# Patient Record
Sex: Male | Born: 1960 | Race: White | Hispanic: No | Smoking: Former smoker
Health system: Southern US, Community
[De-identification: ages and names within clinical notes are randomized; demographics above are authoritative.]

## PROBLEM LIST (undated history)

## (undated) DIAGNOSIS — C4490 Unspecified malignant neoplasm of skin, unspecified: Secondary | ICD-10-CM

## (undated) HISTORY — DX: Unspecified malignant neoplasm of skin, unspecified: C44.90

## (undated) HISTORY — PX: NO PAST SURGERIES: SHX2092

## (undated) HISTORY — PX: APPENDECTOMY: SHX54

---

## 2000-03-19 ENCOUNTER — Encounter: Payer: Self-pay | Admitting: Family Medicine

## 2000-03-19 ENCOUNTER — Encounter: Admission: RE | Admit: 2000-03-19 | Discharge: 2000-03-19 | Payer: Self-pay | Admitting: Family Medicine

## 2008-04-09 ENCOUNTER — Ambulatory Visit: Payer: Self-pay | Admitting: Cardiology

## 2008-04-09 ENCOUNTER — Ambulatory Visit: Payer: Self-pay

## 2008-04-09 ENCOUNTER — Encounter: Payer: Self-pay | Admitting: Cardiology

## 2008-04-09 LAB — CONVERTED CEMR LAB: CK-MB: 2.7 ng/mL (ref 0.3–4.0)

## 2008-05-18 ENCOUNTER — Ambulatory Visit: Payer: Self-pay | Admitting: Cardiology

## 2011-05-05 NOTE — Assessment & Plan Note (Signed)
[facility]                            CARDIOLOGY OFFICE NOTE   KUMAR, FALWELL                         MRN:          [id number]  DATE:04/09/2008                            DOB:          27-Jun-1961    ADDENDUM:  Mr. Schleyer labs returned.  His D-dimer is less than 0.22,  making pulmonary embolus unlikely.  His troponin I is normal at 0.01.  He has essentially ruled out for myocardial infarction, as this was  drawn 14 hours after symptom onset.  I did briefly review his  echocardiogram, and his LV function was normal as well.  The etiology of  his pain is not clear to me.  It may be musculoskeletal or GI.  I have  asked him to take Motrin as needed.  I will schedule him to have an  exercise treadmill for risk stratification.  I will do this in the next  week or two.     Madolyn Frieze Jens Som, MD, [facility]  Electronically Signed    BSC/MedQ  DD: 04/09/2008  DT: 04/09/2008  Job #: [id number]   cc:   [facility]

## 2011-05-05 NOTE — Assessment & Plan Note (Signed)
[facility]                            CARDIOLOGY OFFICE NOTE   Sean Ortiz, Sean Ortiz                         MRN:          [id number]  DATE:04/09/2008                            DOB:          [birth year]    HISTORY:  The patient is a 50 year old male with no prior cardiac  history  whom I am asked to evaluate for chest pain.  Note, he typically  does not have dyspnea on exertion, orthopnea, PND, pedal edema,  palpitations, presyncope, syncope, or exertional chest pain.  He works  third shift.  At approximately 11:00 p.m. last evening, the patient  developed a pressure sensation in his upper chest area.  The pain did  not radiate.  There was no associated nausea, vomiting, shortness of  breath, or diaphoresis.  The pain was not positional, and initially was  not pleuritic.  It was not related to food.  It lasted for 1-2 hours,  and he took 5 aspirin; and it did improve, although never completely  resolving.  He states since that time it has increased with lying flat,  and also with deep inspiration.   He went to [facility] and his  electrocardiogram was felt to be abnormal.  His pain also did not  resolve with a GI cocktail.  We were, therefore, asked to further  evaluate.  Note, at the time of the evaluation his symptoms have been  approximately 14 hours in duration.  Also of note, was that he did  travel to [place] approximately 3 weeks ago which was a 6-hour trip.  He  has not had any leg injury.   He is on no medications routinely.  He takes Advil occasionally as  needed.   He has no known drug allergies.   SOCIAL HISTORY:  He has remote his tobacco use, but has not smoked since  2006.  He consumes between 10-12 beers each weekend by his report.   FAMILY HISTORY:  Negative for coronary disease.   PAST MEDICAL HISTORY:  There is no diabetes mellitus, hypertension or  hyperlipidemia.  He has had no previous  surgeries.  He did state that  years ago he had palpitations; and had a negative Holter monitor, but  there is no other history noted.   REVIEW OF SYSTEMS:  He denies any headaches, fevers, or chills.  There  is no productive cough or hemoptysis.  There is no dysphagia,  odynophagia, melena or hematochezia.  There is no dysuria or hematuria.  There is no seizure activity.  There is no orthopnea, PND, or pedal  edema.  There is no claudication noted.  The remaining systems are  negative.   PHYSICAL EXAM:  Today shows a blood pressure of 122/90 and his pulse was  60.  He weighs 198 pounds.  He is well-developed, well-nourished in no acute stress.  SKIN:  Warm, dry.  He does not appear to be depressed, and there is no peripheral clubbing.  BACK:  Normal.  HEENT:  Normal with normal eyelids.  NECK:  Supple with a normal upstroke  bilaterally.  There are no bruits  noted.  There is no jugular venous distention, and no thyromegaly is  noted.  CHEST:  Clear to auscultation and normal expansion.  CARDIOVASCULAR EXAM:  Reveals a regular rhythm.  Normal S1-S2.  There  are no murmurs, rubs or gallops noted.  Note, he has 2+ radial, femoral,  and dorsalis pedis pulses bilaterally.  They are equal.  ABDOMINAL EXAM:  Nontender.  Positive bowel sounds.  No  hepatosplenomegaly.  No masses appreciated.  There is no abdominal  bruit.  He has no right upper quadrant tenderness.  He has 2+ femoral  pulses bilaterally, and no bruits.  EXTREMITIES:  Show no edema.  I can palpate no cords.  He has 2+  dorsalis pedis pulses bilaterally.  NEUROLOGIC EXAM:  Grossly intact.   I have reviewed the electrocardiogram from [facility] performed on April 09, 2008 at 8:43 a.m..  This showed a  sinus rhythm at a rate of 75.  The axis was normal.  There is no RV  conduction delay, and a prior septal infarct cannot be excluded.  There  are no ST changes noted.  We did repeat his  electrocardiogram at 11:56  here in her office.  This showed a sinus rhythm at a rate of 57.  There  was no RV conduction delay, but there were no ST changes noted.   DIAGNOSIS:  Atypical chest pain.  Sean Ortiz presents with chest pain of  uncertain etiology.  It is now 14 hours in duration.  His  electrocardiogram shows no ST changes.  We will plan to check cardiac  markers now.  If they are negative, then he has essentially ruled out,  as we would expect these to be elevated 14 hours after the onset of  symptoms.  Note, he has also travel to [place] 3 weeks ago.  I think  that the chances of a pulmonary embolus are unlikely, but I will check a  D-dimer to exclude this.  We will also check an echocardiogram as there  is some description that may be consistent with pericarditis as he  states that it gets worse when he takes a deep breath, and also when he  lies flat.  If the above is negative, then we may give him a short  course of nonsteroidals.  I do not find him to be tender on examination,  and I doubt that this is musculoskeletal.  It also does not sound GI in  etiology.  We will make further recommendations once we have the above  information.     Madolyn Frieze Jens Som, MD, [facility]  Electronically Signed    BSC/MedQ  DD: 04/09/2008  DT: 04/09/2008  Job #: [id number]

## 2011-05-05 NOTE — Procedures (Signed)
 HEALTHCARE                              EXERCISE Zada Girt   QUENTEN, NAWAZ                         MRN:          [id number]  DATE:05/18/2008                            DOB:          01-20-1961    The patient is a 50 year old male, that I recently saw in the office on  April 09, 2008, secondary to atypical chest pain.  This study is  performed for risk stratification.   The patient exercised for a duration of 9 minutes and 52 seconds on the  Bruce protocol, which is equivalent of 11.7 METS.  His heart rate  increased from resting of 69 to a maximum of 162 which was 92% of his  predicted maximum.  His blood pressure increased from 117/83 to 171/77.  There was no chest pain during the study.  There were no ST changes.  This study was terminated secondary to leg pain.   FINAL INTERPRETATION:  Negative adequate exercise tolerance test.     Madolyn Frieze. Jens Som, MD, [facility]  Electronically Signed    BSC/MedQ  DD: 05/18/2008  DT: 05/18/2008  Job #: [id number]

## 2014-10-15 ENCOUNTER — Encounter (INDEPENDENT_AMBULATORY_CARE_PROVIDER_SITE_OTHER): Payer: Self-pay

## 2014-10-15 ENCOUNTER — Ambulatory Visit (INDEPENDENT_AMBULATORY_CARE_PROVIDER_SITE_OTHER): Payer: 59 | Admitting: Family Medicine

## 2014-10-15 ENCOUNTER — Encounter: Payer: Self-pay | Admitting: Family Medicine

## 2014-10-15 ENCOUNTER — Telehealth: Payer: Self-pay | Admitting: Family Medicine

## 2014-10-15 VITALS — BP 131/83 | HR 72 | Temp 99.0°F | Wt 224.5 lb

## 2014-10-15 DIAGNOSIS — N508 Other specified disorders of male genital organs: Secondary | ICD-10-CM

## 2014-10-15 DIAGNOSIS — N50819 Testicular pain, unspecified: Secondary | ICD-10-CM

## 2014-10-15 MED ORDER — CIPROFLOXACIN HCL 500 MG PO TABS: 500.0000 mg | ORAL_TABLET | Freq: Two times a day (BID) | ORAL | Status: DC

## 2014-10-15 NOTE — Telephone Encounter (Signed)
APPT MADE FOR TODAY

## 2014-10-15 NOTE — Progress Notes (Signed)
   Subjective:    Patient ID: Sean Ortiz, male    DOB: 17-Dec-1961, 53 y.o.   MRN: [id number]  HPI Patient is here with c/o right testicular discomfort.  He states he was told he had left hernia and he has been keeping an eye on it.  He states he has left testicular swelling and he states the real problem is that he has developed right testicular pain.  Review of Systems  Constitutional: Negative for fever.  HENT: Negative for ear pain.   Eyes: Negative for discharge.  Respiratory: Negative for cough.   Cardiovascular: Negative for chest pain.  Gastrointestinal: Negative for abdominal distention.  Endocrine: Negative for polyuria.  Genitourinary: Negative for difficulty urinating.  Musculoskeletal: Negative for gait problem and neck pain.  Skin: Negative for color change and rash.  Neurological: Negative for speech difficulty and headaches.  Psychiatric/Behavioral: Negative for agitation.       Objective:    BP 131/83  Pulse 72  Temp(Src) 99 F (37.2 C) (Oral)  Wt 224 lb 8 oz (101.833 kg) Physical Exam  Constitutional: He is oriented to person, place, and time. He appears well-developed and well-nourished.  HENT:  Head: Normocephalic and atraumatic.  Mouth/Throat: Oropharynx is clear and moist.  Eyes: Pupils are equal, round, and reactive to light.  Neck: Normal range of motion. Neck supple.  Cardiovascular: Normal rate and regular rhythm.   No murmur heard. Pulmonary/Chest: Effort normal and breath sounds normal.  Abdominal: Soft. Bowel sounds are normal. There is no tenderness.  Genitourinary:  Left testicle with swelling and with soft wormy sensation in the tissue above the epididymis.  The right teste is soft but epididymis is tender.  No inguinal hernia noted.  Circumcised with normal male phalus.  Neurological: He is alert and oriented to person, place, and time.  Skin: Skin is warm and dry.  Psychiatric: He has a normal mood and affect.          Assessment  & Plan:     ICD-9-CM ICD-10-CM   1. Testicular pain 608.9 N50.8 ciprofloxacin (CIPRO) 500 MG tablet     US Scrotum     No Follow-up on file.  Lysbeth Penner FNP

## 2014-10-16 ENCOUNTER — Other Ambulatory Visit: Payer: Self-pay | Admitting: Family Medicine

## 2014-10-16 DIAGNOSIS — N50819 Testicular pain, unspecified: Secondary | ICD-10-CM

## 2014-10-17 ENCOUNTER — Telehealth: Payer: Self-pay | Admitting: Family Medicine

## 2014-10-17 ENCOUNTER — Ambulatory Visit (HOSPITAL_COMMUNITY)
Admission: RE | Admit: 2014-10-17 | Discharge: 2014-10-17 | Disposition: A | Discharge status: Home / Self Care | Payer: 59 | Source: Ambulatory Visit | Attending: Family Medicine | Admitting: Family Medicine

## 2014-10-17 DIAGNOSIS — N508 Other specified disorders of male genital organs: Secondary | ICD-10-CM | POA: Insufficient documentation

## 2014-10-17 DIAGNOSIS — N50819 Testicular pain, unspecified: Secondary | ICD-10-CM

## 2014-10-17 NOTE — Telephone Encounter (Signed)
Pt notified of [place] results Instructed pt to finish antibiotic and to call back if no improvement Verbalizes understanding

## 2014-10-17 NOTE — Telephone Encounter (Signed)
He really wants to talk to Sean Ortiz about his Exam and the treatment and the scans he had.groin  He had right abdomen pain and groin pain

## 2014-10-18 NOTE — Telephone Encounter (Signed)
Pt notified of results

## 2014-10-18 NOTE — Telephone Encounter (Signed)
Message copied by Marin Olp on Thu Oct 18, 2014  9:52 AM ------      Message from: Lysbeth Penner      Created: Wed Oct 17, 2014  6:22 PM       Left hydrocele - air filled sac, benign and left varicocele, varicose veins in left scrotum, both findings benign. ------

## 2015-01-03 ENCOUNTER — Encounter: Payer: Self-pay | Admitting: Family Medicine

## 2015-01-03 ENCOUNTER — Ambulatory Visit (INDEPENDENT_AMBULATORY_CARE_PROVIDER_SITE_OTHER): Payer: 59 | Admitting: Family Medicine

## 2015-01-03 VITALS — BP 135/88 | HR 74 | Temp 97.1°F | Ht 71.5 in | Wt 222.0 lb

## 2015-01-03 DIAGNOSIS — Z23 Encounter for immunization: Secondary | ICD-10-CM

## 2015-01-03 DIAGNOSIS — Z0000 Encounter for general adult medical examination without abnormal findings: Secondary | ICD-10-CM

## 2015-01-03 DIAGNOSIS — Z1211 Encounter for screening for malignant neoplasm of colon: Secondary | ICD-10-CM

## 2015-01-03 NOTE — Progress Notes (Signed)
   Subjective:    Patient ID: Sean Ortiz, male    DOB: 08-Oct-1961, 54 y.o.   MRN: [id number]  HPI Patient comes in today for his annual physical. He would like a flu shot as well.  There are no active problems to display for this patient.  Outpatient Encounter Prescriptions as of 01/03/2015  Medication Sig  . glucosamine-chondroitin 500-400 MG tablet Take 1 tablet by mouth daily.  . [DISCONTINUED] ciprofloxacin (CIPRO) 500 MG tablet Take 1 tablet (500 mg total) by mouth 2 (two) times daily.      Review of Systems     Objective:   Physical Exam  BP 135/88 mmHg  Pulse 74  Temp(Src) 97.1 F (36.2 C) (Oral)  Ht 5' 11.5" (1.816 m)  Wt 222 lb (100.699 kg)  BMI 30.53 kg/m2       Assessment & Plan:

## 2015-01-03 NOTE — Progress Notes (Signed)
   Subjective:    Patient ID: Sean Ortiz, male    DOB: 24-Aug-1961, 54 y.o.   MRN: [id number]  HPI 54 year old gentleman here for an annual physical. Generally feels well. There is no family history to suggest he is at risk for disease. He does admit to being overweight and as a New Year's resolution is trying to work on that. He had some chest pain several years ago and was evaluated but there was no finding of coronary disease.  We discussed screening exams at age 76 and he is leaning toward colonoscopy but we also had discussion of prostate exams and he declines that at present    Review of Systems  Constitutional: Negative.   HENT: Negative.   Eyes: Negative.   Respiratory: Negative.  Negative for shortness of breath.   Cardiovascular: Negative.  Negative for chest pain and leg swelling.  Gastrointestinal: Negative.   Genitourinary: Negative.   Musculoskeletal: Negative.   Skin: Negative.   Neurological: Negative.   Psychiatric/Behavioral: Negative.   All other systems reviewed and are negative.      Objective:   Physical Exam  Constitutional: He is oriented to person, place, and time. He appears well-developed and well-nourished.  HENT:  Head: Normocephalic.  Right Ear: External ear normal.  Left Ear: External ear normal.  Nose: Nose normal.  Mouth/Throat: Oropharynx is clear and moist.  Eyes: Conjunctivae and EOM are normal. Pupils are equal, round, and reactive to light.  Neck: Normal range of motion. Neck supple.  Cardiovascular: Normal rate, regular rhythm, normal heart sounds and intact distal pulses.   Pulmonary/Chest: Effort normal and breath sounds normal.  Abdominal: Soft. Bowel sounds are normal.  Musculoskeletal: Normal range of motion.  Neurological: He is alert and oriented to person, place, and time.  Skin: Skin is warm and dry.  Psychiatric: He has a normal mood and affect. His behavior is normal. Judgment and thought content normal.            Assessment & Plan:  1. Encounter for screening colonoscopy  - Ambulatory referral to Gastroenterology  2. Visit for annual health examination  - CMP14+EGFR - Lipid panel - PSA, total and free  3. Encounter for immunization   Wardell Honour MD

## 2015-01-04 ENCOUNTER — Encounter: Payer: Self-pay | Admitting: Internal Medicine

## 2015-01-04 LAB — CMP14+EGFR
ALT: 35 IU/L (ref 0–44)
AST: 29 IU/L (ref 0–40)
Albumin/Globulin Ratio: 1.7 (ref 1.1–2.5)
Albumin: 4.7 g/dL (ref 3.5–5.5)
Alkaline Phosphatase: 46 IU/L (ref 39–117)
BILIRUBIN TOTAL: 0.6 mg/dL (ref 0.0–1.2)
BUN/Creatinine Ratio: 22 — ABNORMAL HIGH (ref 9–20)
BUN: 19 mg/dL (ref 6–24)
CALCIUM: 9.1 mg/dL (ref 8.7–10.2)
CHLORIDE: 99 mmol/L (ref 97–108)
CO2: 26 mmol/L (ref 18–29)
Creatinine, Ser: 0.87 mg/dL (ref 0.76–1.27)
GFR, EST AFRICAN AMERICAN: 115 mL/min/{1.73_m2} (ref >59)
GFR, EST NON AFRICAN AMERICAN: 99 mL/min/{1.73_m2} (ref >59)
GLOBULIN, TOTAL: 2.7 g/dL (ref 1.5–4.5)
GLUCOSE: 95 mg/dL (ref 65–99)
Potassium: 4.6 mmol/L (ref 3.5–5.2)
Sodium: 140 mmol/L (ref 134–144)
Total Protein: 7.4 g/dL (ref 6.0–8.5)

## 2015-01-04 LAB — LIPID PANEL
CHOL/HDL RATIO: 5 ratio (ref 0.0–5.0)
CHOLESTEROL TOTAL: 205 mg/dL — AB (ref 100–199)
HDL: 41 mg/dL (ref >39)
LDL CALC: 151 mg/dL — AB (ref 0–99)
Triglycerides: 64 mg/dL (ref 0–149)
VLDL Cholesterol Cal: 13 mg/dL (ref 5–40)

## 2015-01-04 LAB — PSA, TOTAL AND FREE
PSA FREE PCT: 46.7 %
PSA, Free: 0.14 ng/mL
PSA: 0.3 ng/mL (ref 0.0–4.0)

## 2015-02-08 ENCOUNTER — Ambulatory Visit (AMBULATORY_SURGERY_CENTER): Payer: Self-pay | Admitting: *Deleted

## 2015-02-08 VITALS — Ht 72.0 in | Wt 228.0 lb

## 2015-02-08 DIAGNOSIS — Z1211 Encounter for screening for malignant neoplasm of colon: Secondary | ICD-10-CM

## 2015-02-08 MED ORDER — MOVIPREP 100 G PO SOLR: 1.0000 | Freq: Once | ORAL | Status: DC

## 2015-02-08 NOTE — Progress Notes (Signed)
No egg or soy allergy No diet pills No home 02 use Never been sedated in the past  Pt declined emmi video Had to RS from 3-4 to 3-9 due to work travel.

## 2015-02-22 ENCOUNTER — Encounter: Payer: Self-pay | Admitting: Internal Medicine

## 2015-02-27 ENCOUNTER — Encounter: Payer: Self-pay | Admitting: Internal Medicine

## 2015-02-27 ENCOUNTER — Ambulatory Visit (AMBULATORY_SURGERY_CENTER): Payer: 59 | Admitting: Internal Medicine

## 2015-02-27 VITALS — BP 101/64 | HR 52 | Temp 97.1°F | Resp 15 | Ht 71.0 in | Wt 222.0 lb

## 2015-02-27 DIAGNOSIS — Z1211 Encounter for screening for malignant neoplasm of colon: Secondary | ICD-10-CM

## 2015-02-27 MED ORDER — SODIUM CHLORIDE 0.9 % IV SOLN: 500.0000 mL | INTRAVENOUS | Status: DC

## 2015-02-27 NOTE — Progress Notes (Signed)
A/ox3, pleased with MAC, report to RN 

## 2015-02-27 NOTE — Op Note (Signed)
[place]  [address] [place], [id number]   COLONOSCOPY PROCEDURE REPORT  PATIENT: Sean Ortiz, Sean Ortiz  MR#: [id number] BIRTHDATE: 12/02/1961 , 53  yrs. old GENDER: male ENDOSCOPIST: Lafayette Dragon, MD REFERRED JD:BZMCEYE Sabra Heck, M.D. PROCEDURE DATE:  02/27/2015 PROCEDURE:   Colonoscopy, screening First Screening Colonoscopy - Avg.  risk and is 50 yrs.  old or older Yes.  Prior Negative Screening - Now for repeat screening. N/A  History of Adenoma - Now for follow-up colonoscopy & has been > or = to 3 yrs.  N/A  Polyps Removed Today? No. ASA CLASS:   Class I INDICATIONS:average risk patient for colon cancer. MEDICATIONS: Monitored anesthesia care and Propofol 250 mg IV  DESCRIPTION OF PROCEDURE:   After the risks benefits and alternatives of the procedure were thoroughly explained, informed consent was obtained.  The digital rectal exam revealed no abnormalities of the rectum.   The LB [id number]  endoscope was introduced through the anus and advanced to the cecum, which was identified by both the appendix and ileocecal valve. No adverse events experienced.   The quality of the prep was good.  (MoviPrep was used)  The instrument was then slowly withdrawn as the colon was fully examined.      COLON FINDINGS: A normal appearing cecum, ileocecal valve, and appendiceal orifice were identified.  The ascending, transverse, descending, sigmoid colon, and rectum appeared unremarkable. Retroflexed views revealed no abnormalities and Retroflexed views revealed internal Grade I hemorrhoids. The time to cecum = 9.01 Withdrawal time = 6.14   The scope was withdrawn and the procedure completed. COMPLICATIONS: There were no immediate complications.  ENDOSCOPIC IMPRESSION: Normal colonoscopy  RECOMMENDATIONS: High fiber diet Recall colonoscopy in 10 years  eSigned:  Lafayette Dragon, MD 02/27/2015 8:03 AM   cc: Alain Honey, MD

## 2015-02-27 NOTE — Patient Instructions (Signed)
YOU HAD AN ENDOSCOPIC PROCEDURE TODAY AT [facility]:   Refer to the procedure report that was given to you for any specific questions about what was found during the examination.  If the procedure report does not answer your questions, please call your gastroenterologist to clarify.  If you requested that your care partner not be given the details of your procedure findings, then the procedure report has been included in a sealed envelope for you to review at your convenience later.  YOU SHOULD EXPECT: Some feelings of bloating in the abdomen. Passage of more gas than usual.  Walking can help get rid of the air that was put into your GI tract during the procedure and reduce the bloating. If you had a lower endoscopy (such as a colonoscopy or flexible sigmoidoscopy) you may notice spotting of blood in your stool or on the toilet paper. If you underwent a bowel prep for your procedure, you may not have a normal bowel movement for a few days.  Please Note:  You might notice some irritation and congestion in your nose or some drainage.  This is from the oxygen used during your procedure.  There is no need for concern and it should clear up in a day or so.  SYMPTOMS TO REPORT IMMEDIATELY:   Following lower endoscopy (colonoscopy or flexible sigmoidoscopy):  Excessive amounts of blood in the stool  Significant tenderness or worsening of abdominal pains  Swelling of the abdomen that is new, acute  Fever of 100F or higher   For urgent or emergent issues, a gastroenterologist can be reached at any hour by calling [phone].   DIET: Your first meal following the procedure should be a small meal and then it is ok to progress to your normal diet. Heavy or fried foods are harder to digest and may make you feel nauseous or bloated.  Likewise, meals heavy in dairy and vegetables can increase bloating.  Drink plenty of fluids but you should avoid alcoholic beverages for 24  hours.  ACTIVITY:  You should plan to take it easy for the rest of today and you should NOT DRIVE or use heavy machinery until tomorrow (because of the sedation medicines used during the test).    FOLLOW UP: Our staff will call the number listed on your records the next business day following your procedure to check on you and address any questions or concerns that you may have regarding the information given to you following your procedure. If we do not reach you, we will leave a message.  However, if you are feeling well and you are not experiencing any problems, there is no need to return our call.  We will assume that you have returned to your regular daily activities without incident.  If any biopsies were taken you will be contacted by phone or by letter within the next 1-3 weeks.  Please call us at [phone] if you have not heard about the biopsies in 3 weeks.    SIGNATURES/CONFIDENTIALITY: You and/or your care partner have signed paperwork which will be entered into your electronic medical record.  These signatures attest to the fact that that the information above on your After Visit Summary has been reviewed and is understood.  Full responsibility of the confidentiality of this discharge information lies with you and/or your care-partner.  High fiber diet-handout given  Repeat colonoscopy in 10 years 2026.

## 2015-02-28 ENCOUNTER — Telehealth: Payer: Self-pay | Admitting: *Deleted

## 2015-02-28 NOTE — Telephone Encounter (Signed)
  Follow up Call-  Call back number 02/27/2015  Post procedure Call Back phone  # [phone]  Permission to leave phone message Yes     Patient questions:  Do you have a fever, pain , or abdominal swelling? No. Pain Score  0 *  Have you tolerated food without any problems? Yes.    Have you been able to return to your normal activities? Yes.    Do you have any questions about your discharge instructions: Diet   No. Medications  No. Follow up visit  No.  Do you have questions or concerns about your Care? No.  Actions: * If pain score is 4 or above: No action needed, pain <4.

## 2015-04-28 ENCOUNTER — Encounter: Payer: Self-pay | Admitting: *Deleted

## 2017-01-01 ENCOUNTER — Ambulatory Visit (INDEPENDENT_AMBULATORY_CARE_PROVIDER_SITE_OTHER): Payer: 59 | Admitting: Family Medicine

## 2017-01-01 ENCOUNTER — Encounter: Payer: Self-pay | Admitting: Family Medicine

## 2017-01-01 VITALS — BP 131/89 | HR 72 | Temp 97.9°F | Ht 71.0 in | Wt 240.6 lb

## 2017-01-01 DIAGNOSIS — L219 Seborrheic dermatitis, unspecified: Secondary | ICD-10-CM | POA: Diagnosis not present

## 2017-01-01 DIAGNOSIS — L210 Seborrhea capitis: Secondary | ICD-10-CM

## 2017-01-01 DIAGNOSIS — B350 Tinea barbae and tinea capitis: Secondary | ICD-10-CM

## 2017-01-01 MED ORDER — TERBINAFINE HCL 250 MG PO TABS: 250.0000 mg | ORAL_TABLET | Freq: Every day | ORAL | 0 refills | Status: DC

## 2017-01-01 NOTE — Patient Instructions (Signed)
Great to see you!  I treating you for what I believe is a fungal scalp infection, and its no serious form it's called a Kerion. In your case I am treateing it as Tinea Capitis.   We will call with labs within 1 week.   Lets plan to follow up for a physical exam in 6 weeks to see how it went.

## 2017-01-01 NOTE — Progress Notes (Signed)
   HPI  Patient presents today here with a scalp rash.  Patient explains this is been present since a trip to [place] in February 2017. He states it's very itchy with red bumps. He's tried several treatments including minocycline, metronidazole, clindamycin, ketoconazole shampoo, flecainide. Patient has seen 2 different dermatologists.  He had a facial rash, most likely seborrhea, which has been treated very well.  He comes in for another opinion.  PMH: Smoking status noted ROS: Per HPI  Objective: BP 131/89   Pulse 72   Temp 97.9 F (36.6 C) (Oral)   Ht 5\' 11"  (1.803 m)   Wt 240 lb 9.6 oz (109.1 kg)   BMI 33.56 kg/m  Gen: NAD, alert, cooperative with exam HEENT: NCAT CV: RRR, good S1/S2, no murmur Resp: CTABL, no wheezes, non-labored Ext: No edema, warm Neuro: Alert and oriented, No gross deficits  Skin:  5-10 erythematous subcutaneous nodules on the scalp, some with heme crusting and excoriation, no lymph nodes palpable at the occipital base  Assessment and plan:  # Tinea capitis I believe this is the most likely etiology, patient did not have any clear improvement with ketoconazole shampoo No clear evidence of kerion Treat with terbinafine once daily for 6 weeks, checking LFTs today. Follow-up in 6 weeks for annual physical exam, plan referral to academic center if symptoms are not improved at that time.   Meds ordered this encounter  Medications  . fluocinonide (LIDEX) 0.05 % external solution  . ketoconazole (NIZORAL) 2 % shampoo  . terbinafine (LAMISIL) 250 MG tablet    Sig: Take 1 tablet (250 mg total) by mouth daily.    Dispense:  42 tablet    Refill:  0    Laroy Apple, MD [facility] 01/01/2017, 3:48 PM

## 2017-01-02 LAB — CMP14+EGFR
ALT: 34 IU/L (ref 0–44)
AST: 26 IU/L (ref 0–40)
Albumin/Globulin Ratio: 1.5 (ref 1.2–2.2)
Albumin: 4.5 g/dL (ref 3.5–5.5)
Alkaline Phosphatase: 66 IU/L (ref 39–117)
BILIRUBIN TOTAL: 0.3 mg/dL (ref 0.0–1.2)
BUN/Creatinine Ratio: 23 — ABNORMAL HIGH (ref 9–20)
BUN: 19 mg/dL (ref 6–24)
CO2: 25 mmol/L (ref 18–29)
Calcium: 9.2 mg/dL (ref 8.7–10.2)
Chloride: 97 mmol/L (ref 96–106)
Creatinine, Ser: 0.84 mg/dL (ref 0.76–1.27)
GFR calc non Af Amer: 99 mL/min/{1.73_m2} (ref >59)
GFR, EST AFRICAN AMERICAN: 115 mL/min/{1.73_m2} (ref >59)
Globulin, Total: 3 g/dL (ref 1.5–4.5)
Glucose: 93 mg/dL (ref 65–99)
Potassium: 4.2 mmol/L (ref 3.5–5.2)
Sodium: 138 mmol/L (ref 134–144)
TOTAL PROTEIN: 7.5 g/dL (ref 6.0–8.5)

## 2017-02-02 ENCOUNTER — Other Ambulatory Visit: Payer: Self-pay | Admitting: Family Medicine

## 2017-03-10 DIAGNOSIS — L814 Other melanin hyperpigmentation: Secondary | ICD-10-CM | POA: Diagnosis not present

## 2017-03-10 DIAGNOSIS — L821 Other seborrheic keratosis: Secondary | ICD-10-CM | POA: Diagnosis not present

## 2017-03-10 DIAGNOSIS — D1801 Hemangioma of skin and subcutaneous tissue: Secondary | ICD-10-CM | POA: Diagnosis not present

## 2017-03-10 DIAGNOSIS — L570 Actinic keratosis: Secondary | ICD-10-CM | POA: Diagnosis not present

## 2017-12-29 DIAGNOSIS — L239 Allergic contact dermatitis, unspecified cause: Secondary | ICD-10-CM | POA: Diagnosis not present

## 2018-01-05 DIAGNOSIS — Z23 Encounter for immunization: Secondary | ICD-10-CM | POA: Diagnosis not present

## 2018-01-05 DIAGNOSIS — Z0000 Encounter for general adult medical examination without abnormal findings: Secondary | ICD-10-CM | POA: Diagnosis not present

## 2018-01-10 DIAGNOSIS — D729 Disorder of white blood cells, unspecified: Secondary | ICD-10-CM | POA: Diagnosis not present

## 2018-01-12 ENCOUNTER — Encounter: Payer: Self-pay | Admitting: Hematology

## 2018-01-12 ENCOUNTER — Telehealth: Payer: Self-pay | Admitting: Hematology

## 2018-01-12 NOTE — Telephone Encounter (Signed)
Pt has been scheduled to see Dr. Burr Medico on 1/29 at 830am. Pt agreed to the appt date and time. Address verified. Letter mailed. Referring office notified.

## 2018-01-17 NOTE — Progress Notes (Signed)
[place]  Telephone:(336) [phone] Fax:(336) [place] consult Note   Patient Care Team: Wardell Honour, MD as PCP - General (Family Medicine) Ernst Spell, MD as Referring Physician (Urology) 01/18/2018  REFERRAL PHYSICIAN: Aura Dials, MD   CHIEF COMPLAINTS/PURPOSE OF CONSULTATION:  Leukopenia   HISTORY OF PRESENTING ILLNESS:  Sean Ortiz 57 y.o. male is a here because of leukopenia. The patient was referred by his PCP Dr. Sheryn Bison at [place] at [facility]. The patient presents to the clinic today accompanied by his girl friend.   This was discovered by his routine physical exam and lab.  He recently changed his primary care physician, and was first seen by Dr. Sheryn Bison on January 05, 2018.  His CBC showed WBC 2.5, with absolute neutrophil 1.3, absolute lymphocytes 0.90, rest of CBC was normal.  His CMP was also normal.  He was asymptomatic during that visit, denies any fever, chills, cough, or other new symptoms.  He had a repeated CBC on January 10, 2018, which showed WBC 2.7, with absolute neutrophil 1.3, and absolute lymphocytes 1.2, the rest of CBC was normal.  Patient does not recall he had abnormal CBC in the past.  No significant past medical history except basal cell skin cancer on the chest, which was removed, and recurrent skin rash from his scalp to upper chest since about 2 years ago, after he traveled back from [place].  He describes his rash is very itching, he was previously treated with topical and oral antifungal medication, which did not help.  He has seen 2 different dermatologist, and was started on topical steroids recently, which did improve, but he still has recurrent rash.  He denies any significant arthritis, no history of autoimmune disease.  His mother had vasculitis in the past.  Review of system otherwise negative.  He has good appetite and energy level, denies fatigue, weight loss, night sweats, fever or recent episodes of  infection in the past few years.  MEDICAL HISTORY:  Past Medical History:  Diagnosis Date  . Skin cancer     SURGICAL HISTORY: Past Surgical History:  Procedure Laterality Date  . NO PAST SURGERIES      SOCIAL HISTORY: Social History   Socioeconomic History  . Marital status: Divorced    Spouse name: Not on file  . Number of children: Not on file  . Years of education: Not on file  . Highest education level: Not on file  Social Needs  . Financial resource strain: Not on file  . Food insecurity - worry: Not on file  . Food insecurity - inability: Not on file  . Transportation needs - medical: Not on file  . Transportation needs - non-medical: Not on file  Occupational History  . Not on file  Tobacco Use  . Smoking status: Former Smoker    Packs/day: 1.00    Years: 30.00    Pack years: 30.00    Last attempt to quit: 05/21/2006    Years since quitting: 11.6  . Smokeless tobacco: Never Used  Substance and Sexual Activity  . Alcohol use: Yes    Alcohol/week: 1.2 - 1.8 oz    Types: 2 - 3 Cans of beer per week    Comment: occ  . Drug use: No  . Sexual activity: Not on file  Other Topics Concern  . Not on file  Social History Narrative  . Not on file    FAMILY HISTORY: Family History  Problem Relation  Age of Onset  . Vasculitis Mother   . Colon cancer Neg Hx   . Rectal cancer Neg Hx   . Stomach cancer Neg Hx   . Esophageal cancer Neg Hx     ALLERGIES:  has No Known Allergies.  MEDICATIONS:  Current Outpatient Medications  Medication Sig Dispense Refill  . fluocinonide (LIDEX) 0.05 % external solution     . ketoconazole (NIZORAL) 2 % shampoo     . terbinafine (LAMISIL) 250 MG tablet TAKE 1 TABLET BY MOUTH EVERY DAY 42 tablet 0   No current facility-administered medications for this visit.     REVIEW OF SYSTEMS:   Constitutional: Denies fevers, chills or abnormal night sweats Eyes: Denies blurriness of vision, double vision or watery eyes Ears, nose,  mouth, throat, and face: Denies mucositis or sore throat Respiratory: Denies cough, dyspnea or wheezes Cardiovascular: Denies palpitation, chest discomfort or lower extremity swelling Gastrointestinal:  Denies nausea, heartburn or change in bowel habits Skin: (+) skin rash  Lymphatics: Denies new lymphadenopathy or easy bruising Neurological:Denies numbness, tingling or new weaknesses Behavioral/Psych: Mood is stable, no new changes  All other systems were reviewed with the patient and are negative.  PHYSICAL EXAMINATION: ECOG PERFORMANCE STATUS: 0 - Asymptomatic  Vitals:   01/18/18 0837  BP: (!) 141/93  Pulse: 69  Resp: 18  Temp: 98.5 F (36.9 C)  SpO2: 100%   Filed Weights   01/18/18 0837  Weight: 236 lb 12.8 oz (107.4 kg)    GENERAL:alert, no distress and comfortable SKIN: skin color, texture, turgor are normal, (+) diffuse papular rash on his scalp face, neck and upper chest EYES: normal, conjunctiva are pink and non-injected, sclera clear OROPHARYNX:no exudate, no erythema and lips, buccal mucosa, and tongue normal  NECK: supple, thyroid normal size, non-tender, without nodularity LYMPH:  no palpable lymphadenopathy in the cervical, axillary or inguinal LUNGS: clear to auscultation and percussion with normal breathing effort HEART: regular rate & rhythm and no murmurs and no lower extremity edema ABDOMEN:abdomen soft, non-tender and normal bowel sounds Musculoskeletal:no cyanosis of digits and no clubbing  PSYCH: alert & oriented x 3 with fluent speech NEURO: no focal motor/sensory deficits  LABORATORY DATA:  I have reviewed the data as listed CBC    Component Value Date/Time   WBC 2.4 (L) 01/18/2018 0943   RBC 4.53 01/18/2018 0943   RBC 4.53 01/18/2018 0943   HCT 41.8 01/18/2018 0943   PLT 175 01/18/2018 0943   MCV 92.3 01/18/2018 0943   MCH 29.6 01/18/2018 0943   MCHC 32.1 01/18/2018 0943   RDW 13.7 01/18/2018 0943   LYMPHSABS 0.8 (L) 01/18/2018 0943    MONOABS 0.2 01/18/2018 0943   EOSABS 0.0 01/18/2018 0943   BASOSABS 0.0 01/18/2018 0943     CMP Latest Ref Rng & Units 01/01/2017 01/03/2015  Glucose 65 - 99 mg/dL 93 95  BUN 6 - 24 mg/dL 19 19  Creatinine 0.76 - 1.27 mg/dL 0.84 0.87  Sodium 134 - 144 mmol/L 138 140  Potassium 3.5 - 5.2 mmol/L 4.2 4.6  Chloride 96 - 106 mmol/L 97 99  CO2 18 - 29 mmol/L 25 26  Calcium 8.7 - 10.2 mg/dL 9.2 9.1  Total Protein 6.0 - 8.5 g/dL 7.5 7.4  Total Bilirubin 0.0 - 1.2 mg/dL 0.3 0.6  Alkaline Phos 39 - 117 IU/L 66 46  AST 0 - 40 IU/L 26 29  ALT 0 - 44 IU/L 34 35     RADIOGRAPHIC STUDIES: I have personally  reviewed the radiological images as listed and agreed with the findings in the report. No results found.  ASSESSMENT & PLAN:   Sean Ortiz 57 y.o. Caucasian male without significant past medical history, except recurrent skin rash from scalp to upper chest in the past 2 years, is here because of mild leukopenia which was found on his routine lab work.   1. Leukopenia, mild neutropenia and leukopenia -He was found to have a mild leukopenia on his routine wellness exam, with predominant mild neutropenia and mild neutropenia.  Unfortunately I do not have his old CBC to compare, per patient, he had no history of leukopenia in the past.  He has not had recurrent infection lately.  He is asymptomatic. -We discussed the possible etiology of leukopenia, including autoimmune related, folate or B12 deficiency, alcohol or medication induced, virus induced (including acute virus infection, or chronic hepatitis B, hepatitis C or HIV infection), or primary pulmonary disease, such as MDS.  Given his asymptomatic condition, no peripheral adenopathy on exam, no spleen megaly, I think leukemia or lymphoma much less likely. -We also discussed the possibility of normal variant, which we see more in African Americans. -He does have recurrent skin rash, which responded well to topical steroids.  I am wondering if he  has mild underlying autoimmune disease which causes his skin rash and mild leukopenia.  His skin rash has been recurring, bothersome, I discussed with patient if he will get in the oral treatment for his skin rash, such as a short course of prednisone, then we should monitor his CBC when he is on treatment and after treatment, to see if he responds to immunosuppressant.  If he does, he will support autoimmune related neutropenia. -We discussed the risk of infection from his leukopenia.  Given his mild leukopenia, he is not at significant increased risk of infection.  We did discuss infection precautions.  I did not think he warrants any treatment at this point given the mild leukopenia.  -I suggest him to monitor his CBC, if his room became pia gets worse, or he develops other cytopenias, we will definitely get a bone marrow biopsy.    PLAN:  -will repeat CBC with differential, check reticulocyte count, folate, B12, hepatitis B and C, LDH, methylmalonic acid level, SER, ANA, rheumatoid arthritis titer, and I will review his peripheral blood smear. I will call him in a few weeks to discuss the lab results.  -As above lab results are negative, will repeat a CBC in 6 weeks at his primary care physician's office, and I will see him back in 3 months with lab CBC with diff  -pt will call me if he will be treated with oral medication for his skin rash, then we will f/u his CBC every 2-3 weeks when he stars treatment     All questions were answered. The patient knows to call the clinic with any problems, questions or concerns. I spent 30 minutes counseling the patient face to face. The total time spent in the appointment was 40  minutes and more than 50% was on counseling.     Truitt Merle, MD 01/18/2018

## 2018-01-18 ENCOUNTER — Encounter: Payer: Self-pay | Admitting: Hematology

## 2018-01-18 ENCOUNTER — Telehealth: Payer: Self-pay | Admitting: Hematology

## 2018-01-18 ENCOUNTER — Inpatient Hospital Stay: Payer: 59

## 2018-01-18 ENCOUNTER — Inpatient Hospital Stay: Payer: 59 | Attending: Hematology | Admitting: Hematology

## 2018-01-18 DIAGNOSIS — D709 Neutropenia, unspecified: Secondary | ICD-10-CM | POA: Diagnosis not present

## 2018-01-18 DIAGNOSIS — Z87891 Personal history of nicotine dependence: Secondary | ICD-10-CM

## 2018-01-18 DIAGNOSIS — Z85828 Personal history of other malignant neoplasm of skin: Secondary | ICD-10-CM

## 2018-01-18 DIAGNOSIS — D72819 Decreased white blood cell count, unspecified: Secondary | ICD-10-CM | POA: Insufficient documentation

## 2018-01-18 DIAGNOSIS — R21 Rash and other nonspecific skin eruption: Secondary | ICD-10-CM | POA: Diagnosis not present

## 2018-01-18 LAB — VITAMIN B12: Vitamin B-12: 688 pg/mL (ref 180–914)

## 2018-01-18 LAB — CBC WITH DIFFERENTIAL (CANCER CENTER ONLY)
BASOS ABS: 0 10*3/uL (ref 0.0–0.1)
BASOS PCT: 0 %
EOS PCT: 2 %
Eosinophils Absolute: 0 10*3/uL (ref 0.0–0.5)
HCT: 41.8 % (ref 38.4–49.9)
Hemoglobin: 13.4 g/dL (ref 13.0–17.1)
LYMPHS PCT: 36 %
Lymphs Abs: 0.8 10*3/uL — ABNORMAL LOW (ref 0.9–3.3)
MCH: 29.6 pg (ref 27.2–33.4)
MCHC: 32.1 g/dL (ref 32.0–36.0)
MCV: 92.3 fL (ref 79.3–98.0)
MONO ABS: 0.2 10*3/uL (ref 0.1–0.9)
Monocytes Relative: 9 %
NEUTROS ABS: 1.2 10*3/uL — AB (ref 1.5–6.5)
Neutrophils Relative %: 53 %
PLATELETS: 175 10*3/uL (ref 140–400)
RBC: 4.53 MIL/uL (ref 4.20–5.82)
RDW: 13.7 % (ref 11.0–15.6)
WBC: 2.4 10*3/uL — AB (ref 4.0–10.3)

## 2018-01-18 LAB — RETICULOCYTES
RBC.: 4.53 MIL/uL (ref 4.20–5.82)
RETIC COUNT ABSOLUTE: 54.4 10*3/uL (ref 34.8–93.9)
Retic Ct Pct: 1.2 % (ref 0.8–1.8)

## 2018-01-18 LAB — SEDIMENTATION RATE: Sed Rate: 3 mm/hr (ref 0–16)

## 2018-01-18 LAB — LACTATE DEHYDROGENASE: LDH: 181 U/L (ref 125–245)

## 2018-01-18 LAB — SAVE SMEAR

## 2018-01-18 NOTE — Telephone Encounter (Signed)
Gave patient avs report and appointments for May/. Per patient 6 week lab requested per 1/29 los will be done at pcp's office - confirmed with YF.

## 2018-01-19 ENCOUNTER — Other Ambulatory Visit: Payer: Self-pay | Admitting: *Deleted

## 2018-01-19 DIAGNOSIS — D709 Neutropenia, unspecified: Secondary | ICD-10-CM

## 2018-01-19 LAB — HEPATITIS C ANTIBODY: HCV Ab: <0.1 s/co ratio (ref 0.0–0.9)

## 2018-01-19 LAB — FOLATE RBC
FOLATE, RBC: 1205 ng/mL (ref >498)
Folate, Hemolysate: 483.4 ng/mL
HEMATOCRIT: 40.1 % (ref 37.5–51.0)

## 2018-01-19 LAB — RHEUMATOID FACTOR: Rhuematoid fact SerPl-aCnc: <10 IU/mL (ref 0.0–13.9)

## 2018-01-19 LAB — HEPATITIS B SURFACE ANTIGEN: Hepatitis B Surface Ag: NEGATIVE

## 2018-01-19 LAB — PATHOLOGIST SMEAR REVIEW

## 2018-01-19 NOTE — Addendum Note (Signed)
Addended by: Truitt Merle on: 01/19/2018 01:36 PM   Modules accepted: Orders

## 2018-01-20 LAB — ANTINUCLEAR ANTIBODIES, IFA: ANA Ab, IFA: NEGATIVE

## 2018-01-20 LAB — HIV ANTIBODY (ROUTINE TESTING W REFLEX): HIV Screen 4th Generation wRfx: NONREACTIVE

## 2018-01-21 LAB — METHYLMALONIC ACID, SERUM: Methylmalonic Acid, Quantitative: 79 nmol/L (ref 0–378)

## 2018-01-25 ENCOUNTER — Telehealth: Payer: Self-pay | Admitting: Nurse Practitioner

## 2018-01-25 NOTE — Telephone Encounter (Addendum)
I called patient to review labs as per Dr. Ernestina Penna message. Plans to see his dermatologist 2/11. Patient will call us after visit to update on the plan and inform us if he begins treatment for rash. If derm begins treatment, will follow labs while on therapy; if not, will keep lab and f/u as is. He verbalizes understanding. -Sean Ortiz 01/25/18 ----- Message from Truitt Merle, MD sent at 01/22/2018  3:40 PM EST ----- Sean Ortiz,  Could you give him a call and explain his lab results to him? Basically all negative, no lab evidence of autoimmune disease but certainly dies not exclude it due to the limited tests. Will f/u his CBC in future.   My nurse, could you fax my note to his dermatologist? Thanks   Truitt Merle  01/22/2018

## 2018-01-27 ENCOUNTER — Telehealth: Payer: Self-pay | Admitting: *Deleted

## 2018-01-27 NOTE — Telephone Encounter (Signed)
Called pt and left message on voice mail requesting a call back from pt for dermatologist name so lab results can be faxed to that office.

## 2018-01-28 NOTE — Telephone Encounter (Signed)
Faxed last office notes along with lab results to Dr. Orbie Hurst @ [facility] in [place]. Dr. Thompson Caul      Phone     [phone]    ;    Fax       [phone].

## 2018-01-31 DIAGNOSIS — L700 Acne vulgaris: Secondary | ICD-10-CM | POA: Diagnosis not present

## 2018-04-08 ENCOUNTER — Telehealth: Payer: Self-pay | Admitting: Hematology

## 2018-04-08 NOTE — Telephone Encounter (Signed)
Called pt re appts that were moved from 5/1 to 5/2 due to [facility] having bmdc. Left vm for pt re appts.

## 2018-04-13 DIAGNOSIS — Z79899 Other long term (current) drug therapy: Secondary | ICD-10-CM | POA: Diagnosis not present

## 2018-04-13 DIAGNOSIS — L700 Acne vulgaris: Secondary | ICD-10-CM | POA: Diagnosis not present

## 2018-04-13 DIAGNOSIS — L738 Other specified follicular disorders: Secondary | ICD-10-CM | POA: Diagnosis not present

## 2018-04-20 ENCOUNTER — Other Ambulatory Visit: Payer: 59

## 2018-04-20 ENCOUNTER — Ambulatory Visit: Payer: 59 | Admitting: Hematology

## 2018-04-20 NOTE — Progress Notes (Signed)
[place]  Telephone:(336) [phone] Fax:(336) [phone]  Clinic Follow Up Note   Patient Care Team: Aura Dials, MD as PCP - General (Family Medicine) Ernst Spell, MD as Referring Physician (Urology) 04/21/2018  CHIEF COMPLAINTS:  Follow up leukopenia   HISTORY OF PRESENTING ILLNESS:  Sean Ortiz 57 y.o. male is a here because of leukopenia. The patient was referred by his PCP Dr. Sheryn Bison at [place] at [facility]. The patient presents to the clinic today accompanied by his girl friend.   This was discovered by his routine physical exam and lab.  He recently changed his primary care physician, and was first seen by Dr. Sheryn Bison on January 05, 2018.  His CBC showed WBC 2.5, with absolute neutrophil 1.3, absolute lymphocytes 0.90, rest of CBC was normal.  His CMP was also normal.  He was asymptomatic during that visit, denies any fever, chills, cough, or other new symptoms.  He had a repeated CBC on January 10, 2018, which showed WBC 2.7, with absolute neutrophil 1.3, and absolute lymphocytes 1.2, the rest of CBC was normal.  Patient does not recall he had abnormal CBC in the past.  No significant past medical history except basal cell skin cancer on the chest, which was removed, and recurrent skin rash from his scalp to upper chest since about 2 years ago, after he traveled back from [place].  He describes his rash is very itching, he was previously treated with topical and oral antifungal medication, which did not help.  He has seen 2 different dermatologist, and was started on topical steroids recently, which did improve, but he still has recurrent rash.  He denies any significant arthritis, no history of autoimmune disease.  His mother had vasculitis in the past.  Review of system otherwise negative.  He has good appetite and energy level, denies fatigue, weight loss, night sweats, fever or recent episodes of infection in the past few years.   INTERVAL HISTORY:    Sean Ortiz 57 y.o. presents to the office today for follow up regarding his leukopenia. He reports that he is doing well overall. He notes that he was recently evaluated by his dermatologist for the rash to the top of his head, face, and neck. He has since been prescribed acutane which he hasn't filled. He has tried OTC medications, which he believes helped his symptoms previously.   Labs today 04/21/18 show WBC at 3k and neutrophil at 1.2 otherwise CBC WNL.  On review of systems, pt reports rash to the top of his head, neck, and face. He denies any other symptoms at this time. He notes that he is UTD with his colonoscopy and PSA screenings. He quit smoking cigarettes 12 years ago and he smoked 1 PPD for approximately 30 years.      MEDICAL HISTORY:  Past Medical History:  Diagnosis Date  . Skin cancer     SURGICAL HISTORY: Past Surgical History:  Procedure Laterality Date  . NO PAST SURGERIES      SOCIAL HISTORY: Social History   Socioeconomic History  . Marital status: Divorced    Spouse name: Not on file  . Number of children: Not on file  . Years of education: Not on file  . Highest education level: Not on file  Occupational History  . Not on file  Social Needs  . Financial resource strain: Not on file  . Food insecurity:    Worry: Not on file    Inability: Not on file  .  Transportation needs:    Medical: Not on file    Non-medical: Not on file  Tobacco Use  . Smoking status: Former Smoker    Packs/day: 1.00    Years: 30.00    Pack years: 30.00    Last attempt to quit: 05/21/2006    Years since quitting: 11.9  . Smokeless tobacco: Never Used  Substance and Sexual Activity  . Alcohol use: Yes    Alcohol/week: 1.2 - 1.8 oz    Types: 2 - 3 Cans of beer per week    Comment: occ  . Drug use: No  . Sexual activity: Not on file  Lifestyle  . Physical activity:    Days per week: Not on file    Minutes per session: Not on file  . Stress: Not on file   Relationships  . Social connections:    Talks on phone: Not on file    Gets together: Not on file    Attends religious service: Not on file    Active member of club or organization: Not on file    Attends meetings of clubs or organizations: Not on file    Relationship status: Not on file  . Intimate partner violence:    Fear of current or ex partner: Not on file    Emotionally abused: Not on file    Physically abused: Not on file    Forced sexual activity: Not on file  Other Topics Concern  . Not on file  Social History Narrative  . Not on file    FAMILY HISTORY: Family History  Problem Relation Age of Onset  . Vasculitis Mother   . Colon cancer Neg Hx   . Rectal cancer Neg Hx   . Stomach cancer Neg Hx   . Esophageal cancer Neg Hx     ALLERGIES:  has No Known Allergies.  MEDICATIONS:  No current outpatient medications on file.   No current facility-administered medications for this visit.     REVIEW OF SYSTEMS:   Constitutional: Denies fevers, chills or abnormal night sweats Eyes: Denies blurriness of vision, double vision or watery eyes Ears, nose, mouth, throat, and face: Denies mucositis or sore throat Respiratory: Denies cough, dyspnea or wheezes Cardiovascular: Denies palpitation, chest discomfort or lower extremity swelling Gastrointestinal:  Denies nausea, heartburn or change in bowel habits Skin: (+) skin rash  Lymphatics: Denies new lymphadenopathy or easy bruising Neurological:Denies numbness, tingling or new weaknesses Behavioral/Psych: Mood is stable, no new changes  All other systems were reviewed with the patient and are negative.  PHYSICAL EXAMINATION:  ECOG PERFORMANCE STATUS: 0 - Asymptomatic  Vitals:   04/21/18 0855  BP: 135/90  Pulse: (!) 58  Resp: 14  Temp: 97.9 F (36.6 C)  SpO2: 98%   Filed Weights   04/21/18 0855  Weight: 242 lb 12.8 oz (110.1 kg)    GENERAL:alert, no distress and comfortable SKIN: skin color, texture,  turgor are normal, (+) diffuse papular rash on his scalp face, neck and upper chest EYES: normal, conjunctiva are pink and non-injected, sclera clear OROPHARYNX:no exudate, no erythema and lips, buccal mucosa, and tongue normal  NECK: supple, thyroid normal size, non-tender, without nodularity LYMPH:  no palpable lymphadenopathy in the cervical, axillary or inguinal LUNGS: clear to auscultation and percussion with normal breathing effort HEART: regular rate & rhythm and no murmurs and no lower extremity edema ABDOMEN:abdomen soft, non-tender and normal bowel sounds Musculoskeletal:no cyanosis of digits and no clubbing  PSYCH: alert & oriented x 3  with fluent speech NEURO: no focal motor/sensory deficits  LABORATORY DATA:  I have reviewed the data as listed CBC    Component Value Date/Time   WBC 3.0 (L) 04/21/2018 0843   RBC 4.71 04/21/2018 0843   HGB 14.0 04/21/2018 0843   HCT 43.7 04/21/2018 0843   HCT 40.1 01/18/2018 0950   PLT 170 04/21/2018 0843   MCV 92.8 04/21/2018 0843   MCH 29.7 04/21/2018 0843   MCHC 32.0 04/21/2018 0843   RDW 14.0 04/21/2018 0843   LYMPHSABS 1.2 04/21/2018 0843   MONOABS 0.5 04/21/2018 0843   EOSABS 0.1 04/21/2018 0843   BASOSABS 0.1 04/21/2018 0843     CMP Latest Ref Rng & Units 01/01/2017 01/03/2015  Glucose 65 - 99 mg/dL 93 95  BUN 6 - 24 mg/dL 19 19  Creatinine 0.76 - 1.27 mg/dL 0.84 0.87  Sodium 134 - 144 mmol/L 138 140  Potassium 3.5 - 5.2 mmol/L 4.2 4.6  Chloride 96 - 106 mmol/L 97 99  CO2 18 - 29 mmol/L 25 26  Calcium 8.7 - 10.2 mg/dL 9.2 9.1  Total Protein 6.0 - 8.5 g/dL 7.5 7.4  Total Bilirubin 0.0 - 1.2 mg/dL 0.3 0.6  Alkaline Phos 39 - 117 IU/L 66 46  AST 0 - 40 IU/L 26 29  ALT 0 - 44 IU/L 34 35     RADIOGRAPHIC STUDIES: I have personally reviewed the radiological images as listed and agreed with the findings in the report. No results found.  ASSESSMENT & PLAN:   Sean Ortiz 57 y.o. Caucasian male without significant past  medical history, except recurrent skin rash from scalp to upper chest in the past 2 years, is here because of mild leukopenia which was found on his routine lab work.   1. Leukopenia, mild neutropenia -He was found to have a mild leukopenia on his routine wellness exam, with predominant mild neutropenia and mild neutropenia.  Unfortunately I do not have his old CBC to compare, per patient, he had no history of leukopenia in the past.  He has not had recurrent infection lately.  He is asymptomatic. -Reviewed previous labs (folate RBC, vitamin B12, Hepatitis B, HIV, CBC, Reticulocytes, LDH, MMA, Sed rate, ANA, Hepatitis C, and Rheumatoid factor) from 01/18/2018 with patient today. All labs were WNL except for CBC with WBC at 2.4, neutrophil at 1.2, and lymphs abs at 0.8.  His lab work was basically negative. -He does have recurrent skin rash, which responded well to topical steroids.  I am wondering if he has mild underlying autoimmune disease which causes his skin rash and mild leukopenia.  His skin rash has been recurring, bothersome, I discussed with patient if he will get in the oral treatment for his skin rash, such as a short course of prednisone, then we should monitor his CBC when he is on treatment and after treatment, to see if he responds to immunosuppressant.  If he does, he will support autoimmune related neutropenia. -We previously discussed the risk of infection from his leukopenia.  Given his mild leukopenia, he is not at significant increased risk of infection.  We did discuss infection precautions.  I did not think he warrants any treatment at this point given the mild leukopenia.  -I previously suggested him to monitor his CBC, if his room became pia gets worse, or he develops other cytopenias, we will definitely get a bone marrow biopsy. --Reviewed today's labs (04/21/18) that showed:WBC at 3k and neutrophil at 1.2 otherwise CBC WNL. -Advised the  patient that due to the prior labs and labs  today, that there is no need for treatment at this time.  -Discussed gross factor with patient today and the implications for it's use.  -Advised that if the patient's WBC's are extremely low, then he may reach out to the office for an office visit and further evaluation.  -Discussed that the patient maintain and remain UTD with all pertinent cancer screenings relative to his age (Lung cancer screening, colonoscopy, and PSA) -Lab in 4 months, he will see his PCP in early 2020 -F/u in 12 months    PLAN:  -Maintain follow up with Dr. Sheryn Bison in January 2020 for annual physical and lab -Lab in 4 months -F/u in 12 months    All questions were answered. The patient knows to call the clinic with any problems, questions or concerns. I spent 10 minutes counseling the patient face to face. The total time spent in the appointment was 15 minutes and more than 50% was on counseling.  This document serves as a record of services personally performed by Truitt Merle, MD. It was created on her behalf by Steva Colder, a trained medical scribe. The creation of this record is based on the scribe's personal observations and the provider's statements to them.   I have reviewed the above documentation for accuracy and completeness, and I agree with the above.      Truitt Merle, MD 04/21/2018

## 2018-04-21 ENCOUNTER — Inpatient Hospital Stay: Payer: 59 | Attending: Hematology

## 2018-04-21 ENCOUNTER — Encounter: Payer: Self-pay | Admitting: Hematology

## 2018-04-21 ENCOUNTER — Inpatient Hospital Stay (HOSPITAL_BASED_OUTPATIENT_CLINIC_OR_DEPARTMENT_OTHER): Payer: 59 | Admitting: Hematology

## 2018-04-21 ENCOUNTER — Telehealth: Payer: Self-pay

## 2018-04-21 ENCOUNTER — Other Ambulatory Visit: Payer: Self-pay | Admitting: Hematology

## 2018-04-21 VITALS — BP 135/90 | HR 58 | Temp 97.9°F | Resp 14 | Ht 71.0 in | Wt 242.8 lb

## 2018-04-21 DIAGNOSIS — R21 Rash and other nonspecific skin eruption: Secondary | ICD-10-CM

## 2018-04-21 DIAGNOSIS — D709 Neutropenia, unspecified: Secondary | ICD-10-CM | POA: Diagnosis not present

## 2018-04-21 DIAGNOSIS — Z87891 Personal history of nicotine dependence: Secondary | ICD-10-CM

## 2018-04-21 DIAGNOSIS — Z85828 Personal history of other malignant neoplasm of skin: Secondary | ICD-10-CM | POA: Diagnosis not present

## 2018-04-21 LAB — CBC WITH DIFFERENTIAL (CANCER CENTER ONLY)
BASOS ABS: 0.1 10*3/uL (ref 0.0–0.1)
Basophils Relative: 4 %
EOS PCT: 2 %
Eosinophils Absolute: 0.1 10*3/uL (ref 0.0–0.5)
HEMATOCRIT: 43.7 % (ref 38.4–49.9)
Hemoglobin: 14 g/dL (ref 13.0–17.1)
LYMPHS ABS: 1.2 10*3/uL (ref 0.9–3.3)
LYMPHS PCT: 38 %
MCH: 29.7 pg (ref 27.2–33.4)
MCHC: 32 g/dL (ref 32.0–36.0)
MCV: 92.8 fL (ref 79.3–98.0)
Monocytes Absolute: 0.5 10*3/uL (ref 0.1–0.9)
Monocytes Relative: 15 %
Neutro Abs: 1.2 10*3/uL — ABNORMAL LOW (ref 1.5–6.5)
Neutrophils Relative %: 41 %
PLATELETS: 170 10*3/uL (ref 140–400)
RBC: 4.71 MIL/uL (ref 4.20–5.82)
RDW: 14 % (ref 11.0–14.6)
WBC Count: 3 10*3/uL — ABNORMAL LOW (ref 4.0–10.3)

## 2018-04-21 NOTE — Telephone Encounter (Signed)
Printed avs and calender of upcoming appointment. Per 5/2 los  

## 2018-05-23 DIAGNOSIS — L700 Acne vulgaris: Secondary | ICD-10-CM | POA: Diagnosis not present

## 2018-05-23 DIAGNOSIS — Z79899 Other long term (current) drug therapy: Secondary | ICD-10-CM | POA: Diagnosis not present

## 2018-05-23 DIAGNOSIS — K130 Diseases of lips: Secondary | ICD-10-CM | POA: Diagnosis not present

## 2018-06-28 DIAGNOSIS — L700 Acne vulgaris: Secondary | ICD-10-CM | POA: Diagnosis not present

## 2018-06-28 DIAGNOSIS — Z79899 Other long term (current) drug therapy: Secondary | ICD-10-CM | POA: Diagnosis not present

## 2018-06-28 DIAGNOSIS — L853 Xerosis cutis: Secondary | ICD-10-CM | POA: Diagnosis not present

## 2018-08-01 DIAGNOSIS — L218 Other seborrheic dermatitis: Secondary | ICD-10-CM | POA: Diagnosis not present

## 2018-08-01 DIAGNOSIS — K130 Diseases of lips: Secondary | ICD-10-CM | POA: Diagnosis not present

## 2018-08-01 DIAGNOSIS — L700 Acne vulgaris: Secondary | ICD-10-CM | POA: Diagnosis not present

## 2018-08-18 ENCOUNTER — Inpatient Hospital Stay: Payer: 59

## 2018-08-18 ENCOUNTER — Telehealth: Payer: Self-pay | Admitting: Hematology

## 2018-08-18 NOTE — Telephone Encounter (Signed)
Patient called to reschedule  °

## 2018-08-19 ENCOUNTER — Inpatient Hospital Stay: Payer: 59 | Attending: Hematology

## 2018-08-19 DIAGNOSIS — D709 Neutropenia, unspecified: Secondary | ICD-10-CM | POA: Diagnosis present

## 2018-08-19 LAB — CBC WITH DIFFERENTIAL (CANCER CENTER ONLY)
BASOS ABS: 0 10*3/uL (ref 0.0–0.1)
BASOS PCT: 1 %
EOS PCT: 3 %
Eosinophils Absolute: 0.1 10*3/uL (ref 0.0–0.5)
HCT: 41 % (ref 38.4–49.9)
Hemoglobin: 13.2 g/dL (ref 13.0–17.1)
Lymphocytes Relative: 37 %
Lymphs Abs: 1 10*3/uL (ref 0.9–3.3)
MCH: 29.3 pg (ref 27.2–33.4)
MCHC: 32.3 g/dL (ref 32.0–36.0)
MCV: 90.7 fL (ref 79.3–98.0)
MONO ABS: 0.3 10*3/uL (ref 0.1–0.9)
MONOS PCT: 10 %
NEUTROS ABS: 1.3 10*3/uL — AB (ref 1.5–6.5)
Neutrophils Relative %: 49 %
PLATELETS: 178 10*3/uL (ref 140–400)
RBC: 4.52 MIL/uL (ref 4.20–5.82)
RDW: 14.4 % (ref 11.0–14.6)
WBC Count: 2.6 10*3/uL — ABNORMAL LOW (ref 4.0–10.3)

## 2018-08-23 ENCOUNTER — Telehealth: Payer: Self-pay

## 2018-08-23 NOTE — Telephone Encounter (Signed)
Spoke with patient per Dr. Burr Medico let him know CBC results, mild neutropenia is stable overall.  Patient would like to know what could cause this?  Informed I will speak with Dr. Burr Medico and call him back.

## 2018-08-23 NOTE — Telephone Encounter (Signed)
I called him back, and discussed. He appreciated the call.   Truitt Merle MD

## 2018-08-23 NOTE — Telephone Encounter (Signed)
-----   Message from Truitt Merle, MD sent at 08/20/2018  1:44 PM EDT ----- Let pt know the CBC result, mild neutropenia is stable overall. Thanks   Truitt Merle  08/20/2018

## 2018-09-05 DIAGNOSIS — Z79899 Other long term (current) drug therapy: Secondary | ICD-10-CM | POA: Diagnosis not present

## 2018-09-05 DIAGNOSIS — L700 Acne vulgaris: Secondary | ICD-10-CM | POA: Diagnosis not present

## 2018-09-05 DIAGNOSIS — K130 Diseases of lips: Secondary | ICD-10-CM | POA: Diagnosis not present

## 2018-10-05 DIAGNOSIS — L700 Acne vulgaris: Secondary | ICD-10-CM | POA: Diagnosis not present

## 2018-10-05 DIAGNOSIS — Z79899 Other long term (current) drug therapy: Secondary | ICD-10-CM | POA: Diagnosis not present

## 2018-10-05 DIAGNOSIS — K130 Diseases of lips: Secondary | ICD-10-CM | POA: Diagnosis not present

## 2018-11-07 DIAGNOSIS — L700 Acne vulgaris: Secondary | ICD-10-CM | POA: Diagnosis not present

## 2018-11-07 DIAGNOSIS — K130 Diseases of lips: Secondary | ICD-10-CM | POA: Diagnosis not present

## 2018-11-07 DIAGNOSIS — Z79899 Other long term (current) drug therapy: Secondary | ICD-10-CM | POA: Diagnosis not present

## 2018-11-07 DIAGNOSIS — L309 Dermatitis, unspecified: Secondary | ICD-10-CM | POA: Diagnosis not present

## 2018-11-25 ENCOUNTER — Encounter: Payer: Self-pay | Admitting: Allergy & Immunology

## 2018-11-25 ENCOUNTER — Ambulatory Visit (INDEPENDENT_AMBULATORY_CARE_PROVIDER_SITE_OTHER): Payer: 59 | Admitting: Allergy & Immunology

## 2018-11-25 VITALS — BP 122/90 | HR 78 | Temp 97.9°F | Resp 14 | Ht 70.0 in | Wt 231.0 lb

## 2018-11-25 DIAGNOSIS — J302 Other seasonal allergic rhinitis: Secondary | ICD-10-CM | POA: Diagnosis not present

## 2018-11-25 DIAGNOSIS — J3089 Other allergic rhinitis: Secondary | ICD-10-CM | POA: Diagnosis not present

## 2018-11-25 DIAGNOSIS — T781XXD Other adverse food reactions, not elsewhere classified, subsequent encounter: Secondary | ICD-10-CM | POA: Diagnosis not present

## 2018-11-25 DIAGNOSIS — L308 Other specified dermatitis: Secondary | ICD-10-CM

## 2018-11-25 DIAGNOSIS — L989 Disorder of the skin and subcutaneous tissue, unspecified: Secondary | ICD-10-CM | POA: Insufficient documentation

## 2018-11-25 DIAGNOSIS — T781XXA Other adverse food reactions, not elsewhere classified, initial encounter: Secondary | ICD-10-CM | POA: Insufficient documentation

## 2018-11-25 MED ORDER — MONTELUKAST SODIUM 10 MG PO TABS: 10.0000 mg | ORAL_TABLET | Freq: Every day | ORAL | 5 refills | Status: AC

## 2018-11-25 NOTE — Patient Instructions (Addendum)
1. Seasonal and perennial allergic rhinitis - Testing today showed: ragweed, trees, weeds, grasses, dust mites, cat and cockroach - Copy of test results provided.  - Avoidance measures provided. - Continue with: Zyrtec (cetirizine) 10mg  tablet once daily - Start taking: Singulair (montelukast) 10mg  daily and Nasacort (triamcinolone) two sprays per nostril daily - You can use an extra dose of the antihistamine, if needed, for breakthrough symptoms.  - Consider nasal saline rinses 1-2 times daily to remove allergens from the nasal cavities as well as help with mucous clearance (this is especially helpful to do before the nasal sprays are given) - Consider allergy shots as a means of long-term control. - Allergy shots "re-train" and "reset" the immune system to ignore environmental allergens and decrease the resulting immune response to those allergens (sneezing, itchy watery eyes, runny nose, nasal congestion, etc).    - Allergy shots improve symptoms in 75-85% of patients.  - We can discuss more at the next appointment if the medications are not working for you. - I think the biggest bang for the buck for you with regards to your dermatitis would be dust mite allergen avoidance and cat allergen avoidance.   2. Adverse food reaction - Testing was positive to: Shellfish Mix  and Shrimp (although they were extremely small) - Avoid the above foods for now.  - We will defer training for an EpiPen at this time since your symptoms have not been consistent with anaphylaxis.  - There is a the low positive predictive value of food allergy testing and hence the high possibility of false positives. - In contrast, food allergy testing has a high negative predictive value, therefore if testing is negative we can be relatively assured that they are indeed negative.   3. Return in about 3 months (around 02/24/2019).   Please inform us of any Emergency Department visits, hospitalizations, or changes in symptoms.  Call us before going to the ED for breathing or allergy symptoms since we might be able to fit you in for a sick visit. Feel free to contact us anytime with any questions, problems, or concerns.  It was a pleasure to meet you today!  Websites that have reliable patient information: 1. American Academy of Asthma, Allergy, and Immunology: www.aaaai.org 2. Food Allergy Research and Education (FARE): foodallergy.org 3. Mothers of Asthmatics: http://www.asthmacommunitynetwork.org 4. American College of Allergy, Asthma, and Immunology: MonthlyElectricBill.co.uk   Make sure you are registered to vote! If you have moved or changed any of your contact information, you will need to get this updated before voting!    Reducing Pollen Exposure  The American Academy of Allergy, Asthma and Immunology suggests the following steps to reduce your exposure to pollen during allergy seasons.    1. Do not hang sheets or clothing out to dry; pollen may collect on these items. 2. Do not mow lawns or spend time around freshly cut grass; mowing stirs up pollen. 3. Keep windows closed at night.  Keep car windows closed while driving. 4. Minimize morning activities outdoors, a time when pollen counts are usually at their highest. 5. Stay indoors as much as possible when pollen counts or humidity is high and on windy days when pollen tends to remain in the air longer. 6. Use air conditioning when possible.  Many air conditioners have filters that trap the pollen spores. 7. Use a HEPA room air filter to remove pollen form the indoor air you breathe.  Control of Dog or Cat Allergen  Avoidance is the  best way to manage a dog or cat allergy. If you have a dog or cat and are allergic to dog or cats, consider removing the dog or cat from the home. If you have a dog or cat but don't want to find it a new home, or if your family wants a pet even though someone in the household is allergic, here are some strategies that may help keep  symptoms at bay:  1. Keep the pet out of your bedroom and restrict it to only a few rooms. Be advised that keeping the dog or cat in only one room will not limit the allergens to that room. 2. Don't pet, hug or kiss the dog or cat; if you do, wash your hands with soap and water. 3. High-efficiency particulate air (HEPA) cleaners run continuously in a bedroom or living room can reduce allergen levels over time. 4. Regular use of a high-efficiency vacuum cleaner or a central vacuum can reduce allergen levels. 5. Giving your dog or cat a bath at least once a week can reduce airborne allergen.  Control of House Dust Mite Allergen    House dust mites play a major role in allergic asthma and rhinitis.  They occur in environments with high humidity wherever human skin, the food for dust mites is found. High levels have been detected in dust obtained from mattresses, pillows, carpets, upholstered furniture, bed covers, clothes and soft toys.  The principal allergen of the house dust mite is found in its feces.  A gram of dust may contain 1,000 mites and 250,000 fecal particles.  Mite antigen is easily measured in the air during house cleaning activities.    1. Encase mattresses, including the box spring, and pillow, in an air tight cover.  Seal the zipper end of the encased mattresses with wide adhesive tape. 2. Wash the bedding in water of 130 degrees Farenheit weekly.  Avoid cotton comforters/quilts and flannel bedding: the most ideal bed covering is the dacron comforter. 3. Remove all upholstered furniture from the bedroom. 4. Remove carpets, carpet padding, rugs, and non-washable window drapes from the bedroom.  Wash drapes weekly or use plastic window coverings. 5. Remove all non-washable stuffed toys from the bedroom.  Wash stuffed toys weekly. 6. Have the room cleaned frequently with a vacuum cleaner and a damp dust-mop.  The patient should not be in a room which is being cleaned and should wait 1  hour after cleaning before going into the room. 7. Close and seal all heating outlets in the bedroom.  Otherwise, the room will become filled with dust-laden air.  An electric heater can be used to heat the room. 8. Reduce indoor humidity to less than 50%.  Do not use a humidifier.  Allergy Shots   Allergies are the result of a chain reaction that starts in the immune system. Your immune system controls how your body defends itself. For instance, if you have an allergy to pollen, your immune system identifies pollen as an invader or allergen. Your immune system overreacts by producing antibodies called Immunoglobulin E (IgE). These antibodies travel to cells that release chemicals, causing an allergic reaction.  The concept behind allergy immunotherapy, whether it is received in the form of shots or tablets, is that the immune system can be desensitized to specific allergens that trigger allergy symptoms. Although it requires time and patience, the payback can be long-term relief.  How Do Allergy Shots Work?  Allergy shots work much like a vaccine. Your  body responds to injected amounts of a particular allergen given in increasing doses, eventually developing a resistance and tolerance to it. Allergy shots can lead to decreased, minimal or no allergy symptoms.  There generally are two phases: build-up and maintenance. Build-up often ranges from three to six months and involves receiving injections with increasing amounts of the allergens. The shots are typically given once or twice a week, though more rapid build-up schedules are sometimes used.  The maintenance phase begins when the most effective dose is reached. This dose is different for each person, depending on how allergic you are and your response to the build-up injections. Once the maintenance dose is reached, there are longer periods between injections, typically two to four weeks.  Occasionally doctors give cortisone-type shots that can  temporarily reduce allergy symptoms. These types of shots are different and should not be confused with allergy immunotherapy shots.  Who Can Be Treated with Allergy Shots?  Allergy shots may be a good treatment approach for people with allergic rhinitis (hay fever), allergic asthma, conjunctivitis (eye allergy) or stinging insect allergy.   Before deciding to begin allergy shots, you should consider:  . The length of allergy season and the severity of your symptoms . Whether medications and/or changes to your environment can control your symptoms . Your desire to avoid long-term medication use . Time: allergy immunotherapy requires a major time commitment . Cost: may vary depending on your insurance coverage  Allergy shots for children age 65 and older are effective and often well tolerated. They might prevent the onset of new allergen sensitivities or the progression to asthma.  Allergy shots are not started on patients who are pregnant but can be continued on patients who become pregnant while receiving them. In some patients with other medical conditions or who take certain common medications, allergy shots may be of risk. It is important to mention other medications you talk to your allergist.   When Will I Feel Better?  Some may experience decreased allergy symptoms during the build-up phase. For others, it may take as long as 12 months on the maintenance dose. If there is no improvement after a year of maintenance, your allergist will discuss other treatment options with you.  If you aren't responding to allergy shots, it may be because there is not enough dose of the allergen in your vaccine or there are missing allergens that were not identified during your allergy testing. Other reasons could be that there are high levels of the allergen in your environment or major exposure to non-allergic triggers like tobacco smoke.  What Is the Length of Treatment?  Once the maintenance dose  is reached, allergy shots are generally continued for three to five years. The decision to stop should be discussed with your allergist at that time. Some people may experience a permanent reduction of allergy symptoms. Others may relapse and a longer course of allergy shots can be considered.  What Are the Possible Reactions?  The two types of adverse reactions that can occur with allergy shots are local and systemic. Common local reactions include very mild redness and swelling at the injection site, which can happen immediately or several hours after. A systemic reaction, which is less common, affects the entire body or a particular body system. They are usually mild and typically respond quickly to medications. Signs include increased allergy symptoms such as sneezing, a stuffy nose or hives.  Rarely, a serious systemic reaction called anaphylaxis can develop. Symptoms include swelling  in the throat, wheezing, a feeling of tightness in the chest, nausea or dizziness. Most serious systemic reactions develop within 30 minutes of allergy shots. This is why it is strongly recommended you wait in your doctor's office for 30 minutes after your injections. Your allergist is trained to watch for reactions, and his or her staff is trained and equipped with the proper medications to identify and treat them.  Who Should Administer Allergy Shots?  The preferred location for receiving shots is your prescribing allergist's office. Injections can sometimes be given at another facility where the physician and staff are trained to recognize and treat reactions, and have received instructions by your prescribing allergist.

## 2018-11-25 NOTE — Progress Notes (Signed)
NEW PATIENT  Date of Service/Encounter:  11/25/18  Referring provider: Aura Dials, MD   Assessment:   Seasonal and perennial allergic rhinitis   Adverse food reaction (shrimp)  Inflammatory dermatosis    Mr. 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  He does have multiple sensitizations today, and I think the most likely allergen contributing to his disease process are cat and dust mite.  We did provide avoidance measures for those and all of the things that he was allergic to today.  We are going to continue with the Zyrtec daily and add a nasal spray as well since his nose was quite congested.  We did look at the most common foods as well as some other foods that he was concerned with, and the only one that came back positive with strep.  It was not very large and I anticipate that this is a false positive, but I did recommend that he not use shrimp for a few weeks to see if this provides any improvement in his skin.  He would make an excellent allergen immunotherapy candidate and we briefly discussed this today.    Plan/Recommendations:   1. Seasonal and perennial allergic rhinitis - Testing today showed: ragweed, trees, weeds, grasses, dust mites, cat and cockroach - Copy of test results provided.  - Avoidance measures provided. - Continue with: Zyrtec (cetirizine) 10mg  tablet once daily - Start taking: Singulair (montelukast) 10mg  daily and Nasacort (triamcinolone) two sprays per nostril daily - You can use an extra dose of the antihistamine, if needed, for breakthrough symptoms.  - Consider nasal saline rinses 1-2 times daily to remove allergens from the nasal cavities as well as help with mucous clearance (this is especially helpful to do before the nasal sprays are given) - Consider allergy shots as a means of long-term control. - Allergy shots "re-train" and  "reset" the immune system to ignore environmental allergens and decrease the resulting immune response to those allergens (sneezing, itchy watery eyes, runny nose, nasal congestion, etc).    - Allergy shots improve symptoms in 75-85% of patients.  - We can discuss more at the next appointment if the medications are not working for you. - I think the biggest bang for the buck for you with regards to your dermatitis would be dust mite allergen avoidance and cat allergen avoidance.   2. Adverse food reaction - Testing was positive to: Shellfish Mix  and Shrimp (although they were extremely small) - Avoid the above foods for now.  - We will defer training for an EpiPen at this time since your symptoms have not been consistent with anaphylaxis.  - There is a the low positive predictive value of food allergy testing and hence the high possibility of false positives. - In contrast, food allergy testing has a high negative predictive value, therefore if testing is negative we can be relatively assured that they are indeed negative.   3. Return in about 3 months (around 02/24/2019).  Subjective:   Sean Ortiz is a 57 y.o. male presenting today for evaluation of  Chief Complaint  Patient presents with  . Rash    scalp    Sean Ortiz has a history of the following: Patient Active Problem List   Diagnosis Date Noted  . Seasonal and perennial allergic rhinitis 11/25/2018  . Adverse food reaction 11/25/2018  . 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 Leukopenia 01/18/2018  History obtained from: chart review and patient.  Kristin Bruins was referred by Aura Dials, MD.     Sean Ortiz is a 57 y.o. male presenting for an evaluation of allergies as a contributing factor to a dermatitis on his scalp. He also has some rhinorrhea, but he does not seem overly concerned with it at this point.    He was placed on Acutane recently (April 2019), but he reports that it is not helping. His Dermatologist is  not seeing the improvement that he wanted to see. Therefore it was felt that he might have allergies triggering this. He is seeing [facility] dermatology in [place] (Dr. Tamala Julian). Prior to this, he saw a Dermatologist on [place] and was treated for a basal cell carcinoma.   He does have intense itching which he has been treating with cetirizine. He was unsure of the trigger. He works from home and he would be working on his computer and everything would start itching. The daily cetirizine seems to keep it at [place].   Antwon does notice that he has problems with spicy foods where his head becomes very hot. He tolerates all of the major food allergens without adverse event. He does not actively look for soy, but he does not avoid it either.   Allergic Rhinitis Symptom History: He does have some runny nose but nothing debilitating. He does not even use a nose spray aside from possibly fluticasone, which he does not use often. He typically only uses it during colds. He does endorse watery eyes fairly routinely.   Otherwise, there is no history of other atopic diseases, including asthma, food allergies, drug allergies or stinging insect allergies. There is no significant infectious history. Vaccinations are up to date.    Past Medical History: Patient Active Problem List   Diagnosis Date Noted  . Seasonal and perennial allergic rhinitis 11/25/2018  . Adverse food reaction 11/25/2018  . 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 Leukopenia 01/18/2018    Medication List:  Allergies as of 11/25/2018   No Known Allergies     Medication List        Accurate as of 11/25/18 12:31 PM. Always use your most recent med list.          atorvastatin 10 MG tablet Commonly known as:  LIPITOR   cetirizine 10 MG tablet Commonly known as:  ZYRTEC Take 10 mg by mouth daily.   montelukast 10 MG tablet Commonly known as:  SINGULAIR Take 1 tablet (10 mg total) by mouth at bedtime.   MYORISAN  40 MG capsule Generic drug:  ISOtretinoin   VITAMIN D3 PO Take by mouth.       Birth History: non-contributory  Developmental History: non-contributory.   Past Surgical History: Past Surgical History:  Procedure Laterality Date  . NO PAST SURGERIES       Family History: Family History  Problem Relation Age of Onset  . Vasculitis Mother   . Colon cancer Neg Hx   . Rectal cancer Neg Hx   . Stomach cancer Neg Hx   . Esophageal cancer Neg Hx      Social History: Nadeem lives at home with his girlfriend. He works for [employer]. He has been doing that since 2011. He has had the two cats for around three years or so. Then his girlfriend had two dogs. They moved in 1.5 yrs ago. They recently added a [place] Shepherd puppy.     Review of Systems: a 14-point review of systems is  pertinent for what is mentioned in HPI.  Otherwise, all other systems were negative. Constitutional: negative other than that listed in the HPI Eyes: negative other than that listed in the HPI Ears, nose, mouth, throat, and face: negative other than that listed in the HPI Respiratory: negative other than that listed in the HPI Cardiovascular: negative other than that listed in the HPI Gastrointestinal: negative other than that listed in the HPI Genitourinary: negative other than that listed in the HPI Integument: negative other than that listed in the HPI Hematologic: negative other than that listed in the HPI Musculoskeletal: negative other than that listed in the HPI Neurological: negative other than that listed in the HPI Allergy/Immunologic: negative other than that listed in the HPI    Objective:   Blood pressure 122/90, pulse 78, temperature 97.9 F (36.6 C), temperature source Oral, resp. rate 14, height 5\' 10"  (1.778 m), weight 231 lb (104.8 kg), SpO2 95 %. Body mass index is 33.15 kg/m.   Physical Exam:  General: Alert, interactive, in no acute distress. Pleasant male.  Talkative.  Eyes: No conjunctival injection bilaterally, no discharge on the right, no discharge on the left and no Horner-Trantas dots present. PERRL bilaterally. EOMI without pain. No photophobia.  Ears: Right TM pearly gray with normal light reflex, Left TM pearly gray with normal light reflex, Right TM intact without perforation and Left TM intact without perforation.  Nose/Throat: External nose within normal limits and septum midline. Turbinates markedly edematous and pale with clear discharge. Posterior oropharynx non erythematous without cobblestoning in the posterior oropharynx. Tonsils 2+ without exudates.  Tongue without thrush. Neck: Supple without thyromegaly. Trachea midline. Adenopathy: no enlarged lymph nodes appreciated in the anterior cervical, occipital, axillary, epitrochlear, inguinal, or popliteal regions. Lungs: Clear to auscultation without wheezing, rhonchi or rales. No increased work of breathing. CV: Normal S1/S2. No murmurs. Capillary refill <2 seconds.  Abdomen: Nondistended, nontender. No guarding or rebound tenderness. Bowel sounds present in all fields and hypoactive  Skin: Warm and dry, without lesions or rashes. Extremities:  No clubbing, cyanosis or edema. Neuro:   Grossly intact. No focal deficits appreciated. Responsive to questions.  Diagnostic studies:   Allergy Studies:   Airborne Adult Perc - 12/18/18 0914    Time Antigen Placed  9767    Allergen Manufacturer  Lavella Hammock    Location  Back    Number of Test  59    Panel 1  Select    1. Control-Buffer 50% Glycerol  Negative    2. Control-Histamine 1 mg/ml  2+    3. Albumin saline  Negative    4. [place]  4+    5. [place]  4+    6. [place]  3+    7. [place] Blue  3+    8. Meadow Fescue  2+    9. Perennial Rye  3+    10. Sweet Vernal  3+    11. Timothy  4+    12. Cocklebur  Negative    13. Burweed Marshelder  Negative    14. Ragweed, short  Negative    15. Ragweed, Giant  Negative    16. Plantain,   English  Negative    17. Lamb's Quarters  Negative    18. Sheep Sorrell  2+    19. Rough Pigweed  Negative    20. Marsh Elder, Rough  Negative    21. Mugwort, Common  Negative    22. Ash mix  Negative    23. Wendee Copp  mix  Negative    24. Beech American  Negative    25. Box, Elder  Negative    26. Cedar, red  Negative    27. Cottonwood, [place]  Negative    28. Elm mix  Negative    29. Hickory mix  Negative    30. Maple mix  Negative    31. Oak, [place] mix  Negative    32. Pecan Pollen  2+    33. Pine mix  Negative    34. Sycamore Eastern  Negative    35. [place], Black Pollen  Negative    36. Alternaria alternata  Negative    37. Cladosporium Herbarum  Negative    38. Aspergillus mix  Negative    39. Penicillium mix  Negative    40. Bipolaris sorokiniana (Helminthosporium)  Negative    41. Drechslera spicifera (Curvularia)  Negative    42. Mucor plumbeus  Negative    43. Fusarium moniliforme  Negative    44. Aureobasidium pullulans (pullulara)  Negative    45. Rhizopus oryzae  Negative    46. Botrytis cinera  Negative    47. Epicoccum nigrum  Negative    48. Phoma betae  Negative    49. Candida Albicans  Negative    50. Trichophyton mentagrophytes  Negative    51. Mite, D Farinae  5,000 AU/ml  3+    52. Mite, D Pteronyssinus  5,000 AU/ml  3+    53. Cat Hair 10,000 BAU/ml  Negative    54.  Dog Epithelia  Negative    55. Mixed Feathers  Negative    56. Horse Epithelia  Negative    57. Cockroach, German  2+    58. Mouse  Negative    59. Tobacco Leaf  Negative     Intradermal - 11/25/18 1006    Time Antigen Placed  1004    Allergen Manufacturer  Greer    Location  Arm    Number of Test  9    Intradermal  Select    Control  Negative    Ragweed mix  3+    Tree mix  4+    Mold 1  Negative    Mold 2  Negative    Mold 3  Negative    Mold 4  Negative    Cat  3+    Dog  Negative     Food Adult Perc - 11/25/18 0914    Time Antigen Placed  5462    Allergen Manufacturer   Lavella Hammock    Location  Back    Number of allergen test  35     Control-buffer 50% Glycerol  Negative    Control-Histamine 1 mg/ml  2+    1. Peanut  Negative    2. Soybean  Negative    3. Wheat  Negative    4. Sesame  Negative    5. Milk, cow  Negative    6. Egg White, Chicken  Negative    7. Casein  Negative    8. Shellfish Mix  --   +/-   9. Fish Mix  Negative    10. Cashew  Negative    11. Pecan Food  Negative    12. [place]  Negative    13. Almond  Negative    14. Hazelnut  Negative    15. [place] nut  Negative    16. Coconut  Negative    17. Pistachio  Negative    21.  Tuna  Negative    22. Salmon  Negative    25. Shrimp  --   +/-   30. Barley  Negative    33. Hops  Negative    34. Rice  Negative    37. Pork  Negative    38. [place] Meat  Negative    39. Chicken Meat  Negative    40. Beef  Negative    42. Tomato  Negative    48. Avocado  Negative    49. Onion  Negative    53. Corn  Negative    64. Chocolate/Cacao bean  Negative    70. Garlic  Negative    71. Pepper, black  Negative    72. Mustard  Negative        Allergy testing results were read and interpreted by myself, documented by clinical staff.       Salvatore Marvel, MD [facility]

## 2018-12-05 DIAGNOSIS — L700 Acne vulgaris: Secondary | ICD-10-CM | POA: Diagnosis not present

## 2018-12-05 DIAGNOSIS — K130 Diseases of lips: Secondary | ICD-10-CM | POA: Diagnosis not present

## 2018-12-05 DIAGNOSIS — Z79899 Other long term (current) drug therapy: Secondary | ICD-10-CM | POA: Diagnosis not present

## 2018-12-08 DIAGNOSIS — S29019A Strain of muscle and tendon of unspecified wall of thorax, initial encounter: Secondary | ICD-10-CM | POA: Diagnosis not present

## 2019-01-20 DIAGNOSIS — Z23 Encounter for immunization: Secondary | ICD-10-CM | POA: Diagnosis not present

## 2019-01-20 DIAGNOSIS — Z0000 Encounter for general adult medical examination without abnormal findings: Secondary | ICD-10-CM | POA: Diagnosis not present

## 2019-01-20 DIAGNOSIS — E782 Mixed hyperlipidemia: Secondary | ICD-10-CM | POA: Diagnosis not present

## 2019-01-20 DIAGNOSIS — D709 Neutropenia, unspecified: Secondary | ICD-10-CM | POA: Diagnosis not present

## 2019-02-24 ENCOUNTER — Ambulatory Visit: Payer: 59 | Admitting: Allergy & Immunology

## 2019-03-27 DIAGNOSIS — E7800 Pure hypercholesterolemia, unspecified: Secondary | ICD-10-CM | POA: Diagnosis not present

## 2019-03-27 DIAGNOSIS — Z79899 Other long term (current) drug therapy: Secondary | ICD-10-CM | POA: Diagnosis not present

## 2019-04-05 ENCOUNTER — Telehealth: Payer: Self-pay | Admitting: Hematology

## 2019-04-05 NOTE — Telephone Encounter (Signed)
R/s apt per 4/14 sch message - unable to reach pt - left message and sent reminder letter in the mail

## 2019-04-20 ENCOUNTER — Ambulatory Visit: Payer: 59 | Admitting: Hematology

## 2019-06-15 NOTE — Progress Notes (Signed)
[place]   [phone] [phone]) [phone]   Clinic Follow up Note   Patient Care Team: Aura Dials, MD as PCP - General (Family Medicine) Ernst Spell, MD as Referring Physician (Urology)  Date of Service:  06/19/2019  CHIEF COMPLAINT: Follow up leukopenia    CURRENT THERAPY:  Observation   INTERVAL HISTORY:  Sean Ortiz is here for a follow up leukopenia. He was last seen by me 1 year ago. He presents to the clinic alone.  He is doing well overall, has not had any any significant infection episodes in the past year.   He was seen by allergy specialist Dr. Ernst Bowler in December 2019, his inflammatory dermatitis has resolved by allergy medicine and avoiding source of allergy. He is quite pleased by that. He reports mild loose stool 2-4 times daily for the past few months, and gassy feeling, no significant abdominal pain, cramps, nausea, change of appetite, or weight loss.  He is wondering if he needs to see a GI specialist.  No other complaints.    REVIEW OF SYSTEMS:   Constitutional: Denies fevers, chills or abnormal weight loss Eyes: Denies blurriness of vision Ears, nose, mouth, throat, and face: Denies mucositis or sore throat Respiratory: Denies cough, dyspnea or wheezes Cardiovascular: Denies palpitation, chest discomfort or lower extremity swelling Gastrointestinal:  Denies nausea, heartburn, (+) loose BM  Skin: Denies abnormal skin rashes Lymphatics: Denies new lymphadenopathy or easy bruising Neurological:Denies numbness, tingling or new weaknesses Behavioral/Psych: Mood is stable, no new changes  All other systems were reviewed with the patient and are negative.  MEDICAL HISTORY:  Past Medical History:  Diagnosis Date  . Skin cancer     SURGICAL HISTORY: Past Surgical History:  Procedure Laterality Date  . NO PAST SURGERIES      I have reviewed the social history and family history with the patient and they are unchanged  from previous note.  ALLERGIES:  has No Known Allergies.  MEDICATIONS:  Current Outpatient Medications  Medication Sig Dispense Refill  . atorvastatin (LIPITOR) 10 MG tablet     . cetirizine (ZYRTEC) 10 MG tablet Take 10 mg by mouth daily.    . Cholecalciferol (VITAMIN D3 PO) Take by mouth.    . montelukast (SINGULAIR) 10 MG tablet Take 1 tablet (10 mg total) by mouth at bedtime. 30 tablet 5  . MYORISAN 40 MG capsule      No current facility-administered medications for this visit.     PHYSICAL EXAMINATION: ECOG PERFORMANCE STATUS: 0 - Asymptomatic  Vitals:   06/19/19 0840  BP: 134/89  Pulse: 66  Resp: 18  Temp: 98 F (36.7 C)  SpO2: 98%   Filed Weights   06/19/19 0840  Weight: 241 lb 3.2 oz (109.4 kg)   GENERAL:alert, no distress and comfortable SKIN: skin color, texture, turgor are normal, no rashes or significant lesions EYES: normal, Conjunctiva are pink and non-injected, sclera clear LUNGS: clear to auscultation and percussion with normal breathing effort HEART: regular rate & rhythm and no murmurs and no lower extremity edema ABDOMEN:abdomen soft, non-tender and normal bowel sounds Musculoskeletal:no cyanosis of digits and no clubbing    LABORATORY DATA:  I have reviewed the data as listed CBC Latest Ref Rng & Units 06/19/2019 08/19/2018 04/21/2018  WBC 4.0 - 10.5 K/uL 2.7(L) 2.6(L) 3.0(L)  Hemoglobin 13.0 - 17.0 g/dL 13.0 13.2 14.0  Hematocrit 39.0 - 52.0 % 41.2 41.0 43.7  Platelets 150 - 400 K/uL 177 178 170  CMP Latest Ref Rng & Units 06/19/2019 01/01/2017 01/03/2015  Glucose 70 - 99 mg/dL 101(H) 93 95  BUN 6 - 20 mg/dL 15 19 19   Creatinine 0.61 - 1.24 mg/dL 0.94 0.84 0.87  Sodium 135 - 145 mmol/L 142 138 140  Potassium 3.5 - 5.1 mmol/L 4.7 4.2 4.6  Chloride 98 - 111 mmol/L 109 97 99  CO2 22 - 32 mmol/L 26 25 26   Calcium 8.9 - 10.3 mg/dL 8.4(L) 9.2 9.1  Total Protein 6.5 - 8.1 g/dL 7.2 7.5 7.4  Total Bilirubin 0.3 - 1.2 mg/dL 0.4 0.3 0.6  Alkaline  Phos 38 - 126 U/L 69 66 46  AST 15 - 41 U/L 22 26 29   ALT 0 - 44 U/L 22 34 35   ANC 1.2K today     RADIOGRAPHIC STUDIES: I have personally reviewed the radiological images as listed and agreed with the findings in the report. No results found.   ASSESSMENT & PLAN:  Sean Ortiz is a 58 y.o. male with   1. Leukopenia, mild neutropenia -He was found to have a mild leukopenia on his routine wellness exam, with predominant mild neutropenia. He has not had recurrent infection lately.  He is asymptomatic. -Previous labs (folate RBC, vitamin B12, Hepatitis B, HIV, CBC, Reticulocytes, LDH, MMA, Sed rate, ANA, Hepatitis C, and Rheumatoid factor) from 01/18/2018 were WNL except mild neutropenia -this could be autoimmune related. Given his mild leukopenia, he is not at significant increased risk of infection.  I do not think he needs treatment. -today's lab showed a stable mild neutropenia with ANC 1.2, patient has not had history of recurrent infections.  -I recommend him to monitor his CBC twice a year, follow-up with his PCP, and I will see him as needed in the future.  2. Loose bowel movement  -started 2-3 months ago, no other concerning signs  -I suggested him to contact Dr. Ernst Bowler to see if he has any food allergy -I suggested him to avoid dairy products, to see if his diarrhea improves -I checked his LFTs today which was WNL -will check stool for OB. His last colonoscopy was normal in 2016 -if persists or gets worse, I recommend him to see GI   3.  Seasonal allergy, inflammatory dermatitis -much better  -f/u with PCP or Dr. Ernst Bowler   PLAN:  -lab reviewed, mild neutropenia is stable -he will f/u with PCP for CBC every 6-12 months, I will see him as needed in future      No problem-specific Assessment & Plan notes found for this encounter.   Orders Placed This Encounter  Procedures  . CMP ([facility] only)    Standing Status:   Future    Number of Occurrences:   1     Standing Expiration Date:   06/18/2020  . Occult blood card to lab, stool    Standing Status:   Future    Standing Expiration Date:   06/18/2020  . Occult blood card to lab, stool    Standing Status:   Future    Standing Expiration Date:   06/18/2020  . Occult blood card to lab, stool    Standing Status:   Future    Standing Expiration Date:   06/18/2020  . CBC with Differential ([facility] Only)    Standing Status:   Future    Number of Occurrences:   1    Standing Expiration Date:   06/18/2020   All questions were answered. The patient knows to  call the clinic with any problems, questions or concerns. No barriers to learning was detected. I spent 15 minutes counseling the patient face to face. The total time spent in the appointment was 20 minutes and more than 50% was on counseling and review of test results     Truitt Merle, MD 06/19/2019   I, Joslyn Devon, am acting as scribe for Truitt Merle, MD.   I have reviewed the above documentation for accuracy and completeness, and I agree with the above.

## 2019-06-16 ENCOUNTER — Telehealth: Payer: Self-pay

## 2019-06-16 NOTE — Telephone Encounter (Signed)
Called and left voicemail regarding pre-screening questions for appt on 6/29  

## 2019-06-19 ENCOUNTER — Inpatient Hospital Stay: Payer: 59 | Attending: Hematology | Admitting: Hematology

## 2019-06-19 ENCOUNTER — Telehealth: Payer: Self-pay | Admitting: Hematology

## 2019-06-19 ENCOUNTER — Other Ambulatory Visit: Payer: Self-pay

## 2019-06-19 ENCOUNTER — Encounter: Payer: Self-pay | Admitting: Hematology

## 2019-06-19 ENCOUNTER — Inpatient Hospital Stay: Payer: 59

## 2019-06-19 VITALS — BP 134/89 | HR 66 | Temp 98.0°F | Resp 18 | Ht 70.0 in | Wt 241.2 lb

## 2019-06-19 DIAGNOSIS — D709 Neutropenia, unspecified: Secondary | ICD-10-CM

## 2019-06-19 DIAGNOSIS — D72819 Decreased white blood cell count, unspecified: Secondary | ICD-10-CM | POA: Insufficient documentation

## 2019-06-19 DIAGNOSIS — Z85828 Personal history of other malignant neoplasm of skin: Secondary | ICD-10-CM | POA: Diagnosis not present

## 2019-06-19 DIAGNOSIS — R197 Diarrhea, unspecified: Secondary | ICD-10-CM

## 2019-06-19 DIAGNOSIS — Z79899 Other long term (current) drug therapy: Secondary | ICD-10-CM | POA: Insufficient documentation

## 2019-06-19 LAB — CBC WITH DIFFERENTIAL (CANCER CENTER ONLY)
Abs Immature Granulocytes: 0.01 10*3/uL (ref 0.00–0.07)
Basophils Absolute: 0 10*3/uL (ref 0.0–0.1)
Basophils Relative: 0 %
Eosinophils Absolute: 0.1 10*3/uL (ref 0.0–0.5)
Eosinophils Relative: 2 %
HCT: 41.2 % (ref 39.0–52.0)
Hemoglobin: 13 g/dL (ref 13.0–17.0)
Immature Granulocytes: 0 %
Lymphocytes Relative: 39 %
Lymphs Abs: 1.1 10*3/uL (ref 0.7–4.0)
MCH: 29.2 pg (ref 26.0–34.0)
MCHC: 31.6 g/dL (ref 30.0–36.0)
MCV: 92.6 fL (ref 80.0–100.0)
Monocytes Absolute: 0.3 10*3/uL (ref 0.1–1.0)
Monocytes Relative: 13 %
Neutro Abs: 1.2 10*3/uL — ABNORMAL LOW (ref 1.7–7.7)
Neutrophils Relative %: 46 %
Platelet Count: 177 10*3/uL (ref 150–400)
RBC: 4.45 MIL/uL (ref 4.22–5.81)
RDW: 13.4 % (ref 11.5–15.5)
WBC Count: 2.7 10*3/uL — ABNORMAL LOW (ref 4.0–10.5)
nRBC: 0 % (ref 0.0–0.2)

## 2019-06-19 LAB — CMP (CANCER CENTER ONLY)
ALT: 22 U/L (ref 0–44)
AST: 22 U/L (ref 15–41)
Albumin: 4 g/dL (ref 3.5–5.0)
Alkaline Phosphatase: 69 U/L (ref 38–126)
Anion gap: 7 (ref 5–15)
BUN: 15 mg/dL (ref 6–20)
CO2: 26 mmol/L (ref 22–32)
Calcium: 8.4 mg/dL — ABNORMAL LOW (ref 8.9–10.3)
Chloride: 109 mmol/L (ref 98–111)
Creatinine: 0.94 mg/dL (ref 0.61–1.24)
GFR, Est AFR Am: >60 mL/min (ref >60)
GFR, Estimated: >60 mL/min (ref >60)
Glucose, Bld: 101 mg/dL — ABNORMAL HIGH (ref 70–99)
Potassium: 4.7 mmol/L (ref 3.5–5.1)
Sodium: 142 mmol/L (ref 135–145)
Total Bilirubin: 0.4 mg/dL (ref 0.3–1.2)
Total Protein: 7.2 g/dL (ref 6.5–8.1)

## 2019-06-19 NOTE — Telephone Encounter (Signed)
Scheduled appt per 6/29 los and sch message.

## 2019-06-26 ENCOUNTER — Telehealth: Payer: Self-pay

## 2019-06-26 NOTE — Telephone Encounter (Signed)
-----   Message from Truitt Merle, MD sent at 06/25/2019 10:34 AM EDT ----- Please let pt know his CMP was unremarkable, thanks   Truitt Merle  06/25/2019

## 2019-06-26 NOTE — Telephone Encounter (Signed)
Left voice message on patient identified voice mail regarding lab results.  Per Dr. Burr Medico CMP was unremarkable, no concerns.

## 2019-07-21 ENCOUNTER — Other Ambulatory Visit (HOSPITAL_BASED_OUTPATIENT_CLINIC_OR_DEPARTMENT_OTHER): Payer: Self-pay | Admitting: Physician Assistant

## 2019-07-21 DIAGNOSIS — R109 Unspecified abdominal pain: Secondary | ICD-10-CM

## 2019-07-25 ENCOUNTER — Ambulatory Visit (HOSPITAL_BASED_OUTPATIENT_CLINIC_OR_DEPARTMENT_OTHER)
Admission: RE | Admit: 2019-07-25 | Discharge: 2019-07-25 | Disposition: A | Discharge status: Home / Self Care | Payer: 59 | Source: Ambulatory Visit | Attending: Physician Assistant | Admitting: Physician Assistant

## 2019-07-25 ENCOUNTER — Other Ambulatory Visit: Payer: Self-pay

## 2019-07-25 DIAGNOSIS — R109 Unspecified abdominal pain: Secondary | ICD-10-CM | POA: Diagnosis not present

## 2020-03-08 ENCOUNTER — Ambulatory Visit: Payer: 59 | Attending: Internal Medicine

## 2020-03-08 DIAGNOSIS — Z23 Encounter for immunization: Secondary | ICD-10-CM

## 2020-03-08 NOTE — Progress Notes (Signed)
   Covid-19 Vaccination Clinic  Name:  Sean Ortiz    MRN: [id number] DOB: 12/10/1961  03/08/2020  [name] was observed post Covid-19 immunization for 15 minutes without incident. He was provided with Vaccine Information Sheet and instruction to access the V-Safe system.   [name] was instructed to call 911 with any severe reactions post vaccine: Marland Kitchen Difficulty breathing  . Swelling of face and throat  . A fast heartbeat  . A bad rash all over body  . Dizziness and weakness   Immunizations Administered    Name Date Dose VIS Date Route   Moderna COVID-19 Vaccine 03/08/2020  9:19 AM 0.5 mL 11/21/2019 Intramuscular   Manufacturer: Moderna   Lot: [id number]   [id number]

## 2020-04-10 ENCOUNTER — Ambulatory Visit: Payer: 59 | Attending: Internal Medicine

## 2020-04-10 DIAGNOSIS — Z23 Encounter for immunization: Secondary | ICD-10-CM

## 2020-04-10 NOTE — Progress Notes (Signed)
   Covid-19 Vaccination Clinic  Name:  Sean Ortiz    MRN: [id number] DOB: [birth year]  04/10/2020  Mr. Sean Ortiz was observed post Covid-19 immunization for 15 minutes without incident. He was provided with Vaccine Information Sheet and instruction to access the V-Safe system.   Mr. Sean Ortiz was instructed to call 911 with any severe reactions post vaccine: Marland Kitchen Difficulty breathing  . Swelling of face and throat  . A fast heartbeat  . A bad rash all over body  . Dizziness and weakness   Immunizations Administered    Name Date Dose VIS Date Route   Moderna COVID-19 Vaccine 04/10/2020  9:05 AM 0.5 mL 11/2019 Intramuscular   Manufacturer: Moderna   Lot: [id number]   [id number]

## 2021-06-11 ENCOUNTER — Other Ambulatory Visit: Payer: Self-pay | Admitting: Nurse Practitioner

## 2021-06-11 DIAGNOSIS — R109 Unspecified abdominal pain: Secondary | ICD-10-CM

## 2021-06-20 ENCOUNTER — Other Ambulatory Visit: Payer: Self-pay

## 2021-06-20 ENCOUNTER — Ambulatory Visit (INDEPENDENT_AMBULATORY_CARE_PROVIDER_SITE_OTHER): Payer: 59

## 2021-06-20 DIAGNOSIS — R102 Pelvic and perineal pain: Secondary | ICD-10-CM

## 2021-06-20 DIAGNOSIS — R109 Unspecified abdominal pain: Secondary | ICD-10-CM

## 2021-06-20 MED ORDER — IOHEXOL 300 MG/ML  SOLN
100.0000 mL | Freq: Once | INTRAMUSCULAR | Status: AC | PRN
  Administered 2021-06-20: 100 mL via INTRAVENOUS

## 2021-08-06 IMAGING — CT CT ABD-PELV W/ CM
2 of 5 series · 16 of 46 positions shown, 18 images · IV contrast (APPLIED)
Comparison: 07/25/2019 ultrasound

CLINICAL DATA: 59-year-old male with RIGHT abdominal and pelvic
pain for several weeks.

EXAM:
CT ABDOMEN AND PELVIS WITH CONTRAST
TECHNIQUE: Multidetector CT imaging of the abdomen and pelvis was performed
using the standard protocol following bolus administration of
intravenous contrast.
CONTRAST:  100mL OMNIPAQUE IOHEXOL 300 MG/ML  SOLN

[Series 2: axial st · axial · 0.92mm/px · z∈[+788,+1278]mm · 13 of 110 slices shown, 15 images]
[im 6/110  soft-tissue]
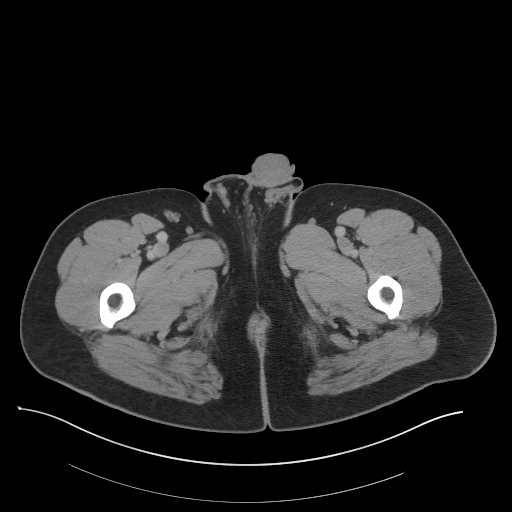
[im 6/110  bone]
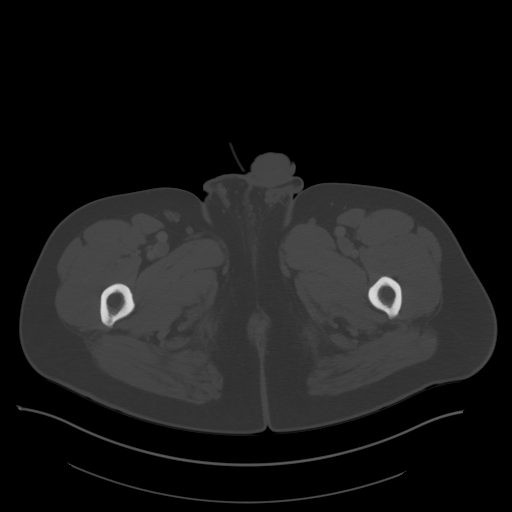
[im 18/110  soft-tissue]
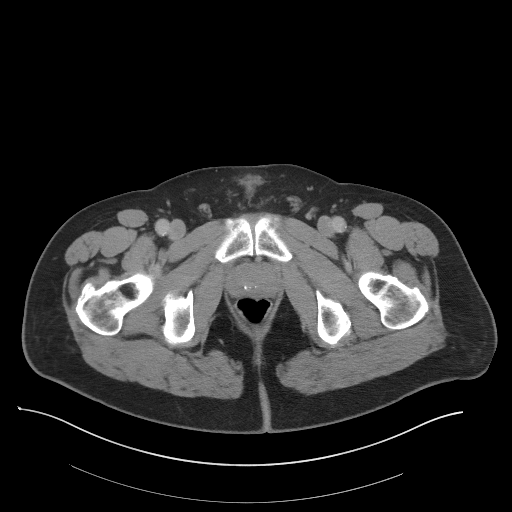
[im 23/110  soft-tissue]
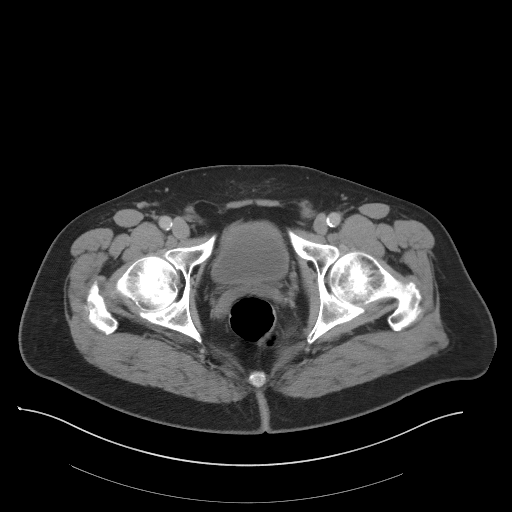
[im 29/110  soft-tissue]
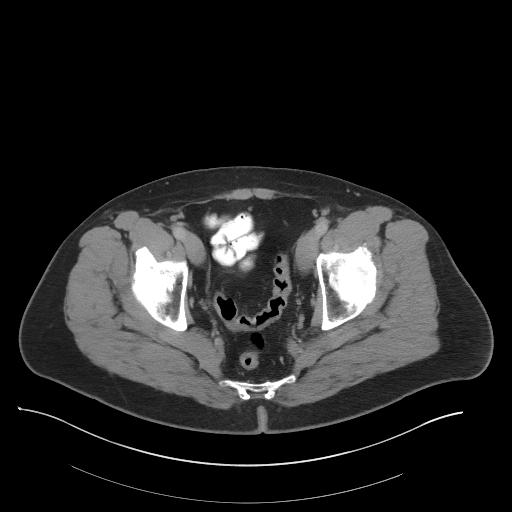
[im 41/110  soft-tissue]
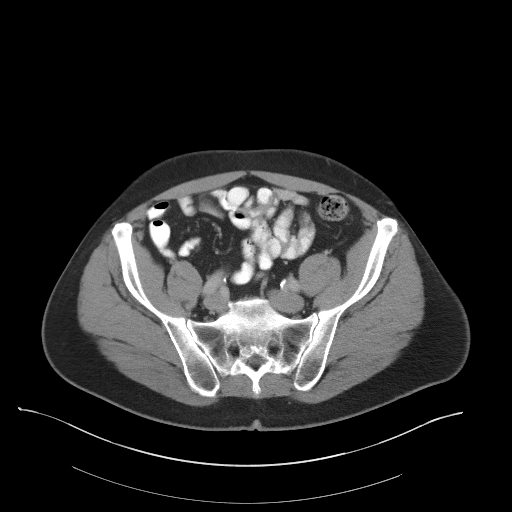
[im 46/110  soft-tissue]
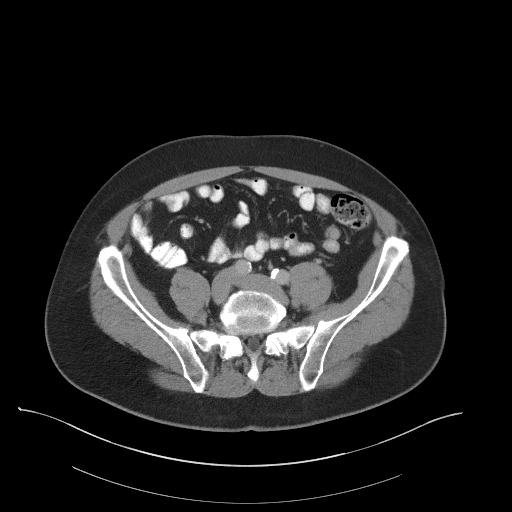
[im 58/110  soft-tissue]
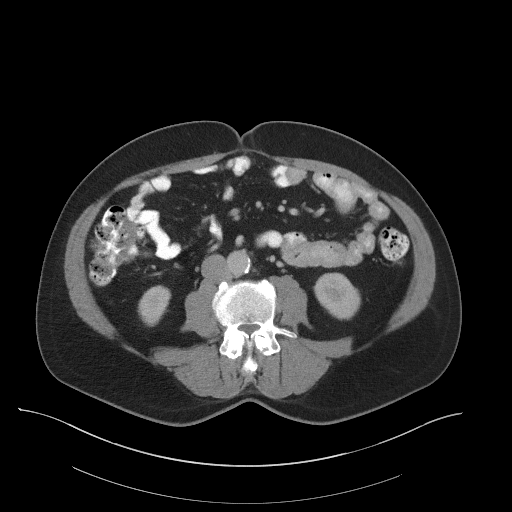
[im 64/110  soft-tissue]
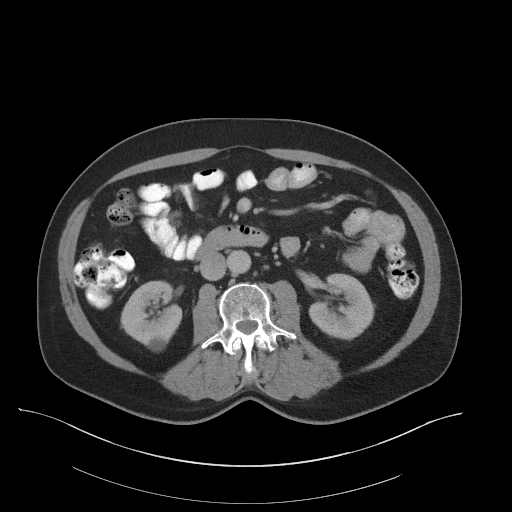
[im 69/110  soft-tissue]
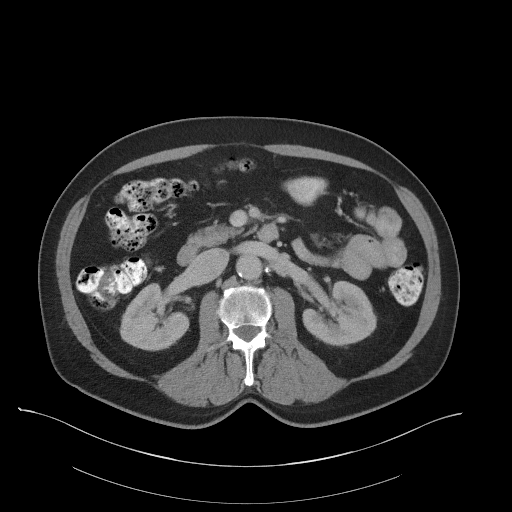
[im 69/110  bone]
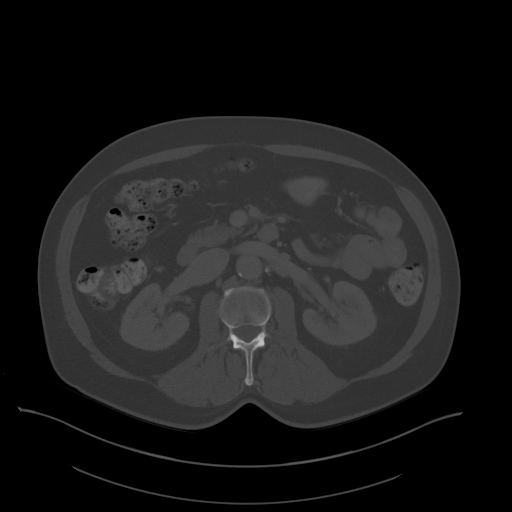
[im 81/110  soft-tissue]
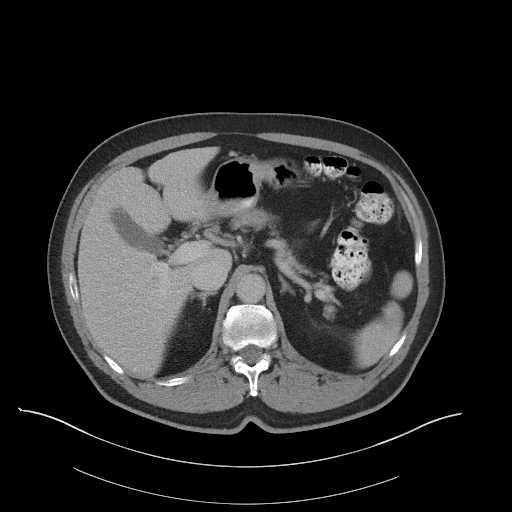
[im 87/110  soft-tissue]
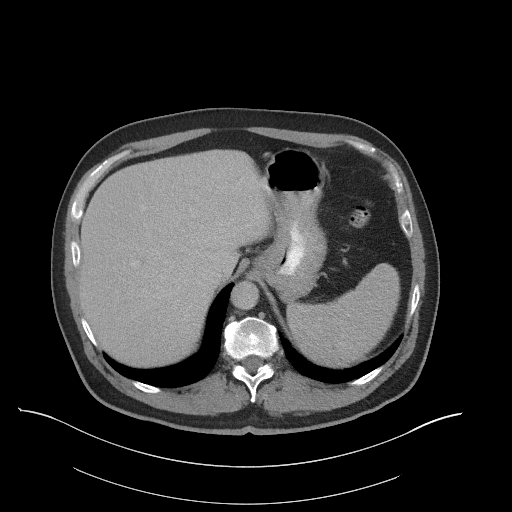
[im 92/110  soft-tissue]
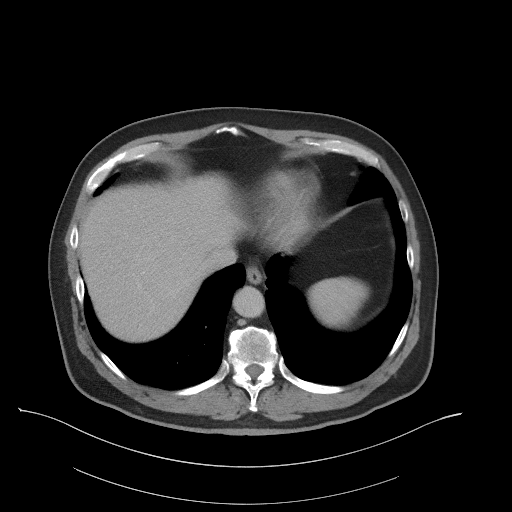
[im 104/110  soft-tissue]
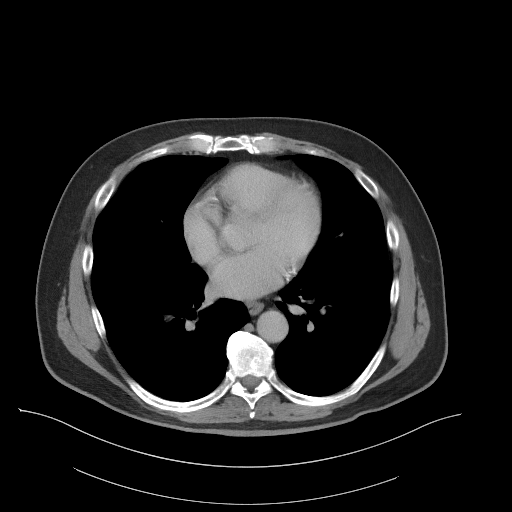

[Series 5: coronal st · coronal · 0.97mm/px · 3 of 101 slices shown]
[im 34/101  soft-tissue]
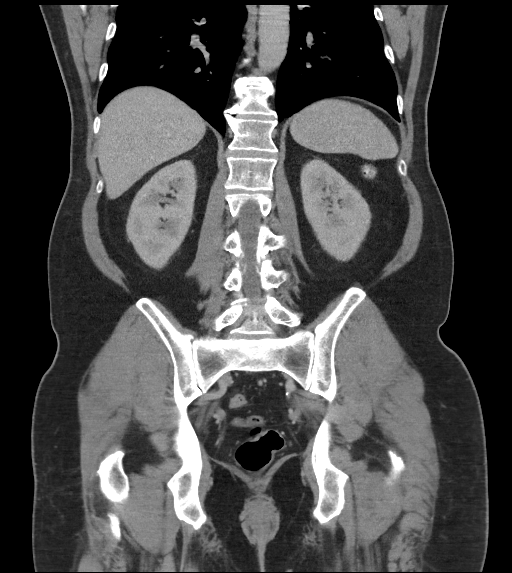
[im 45/101  soft-tissue]
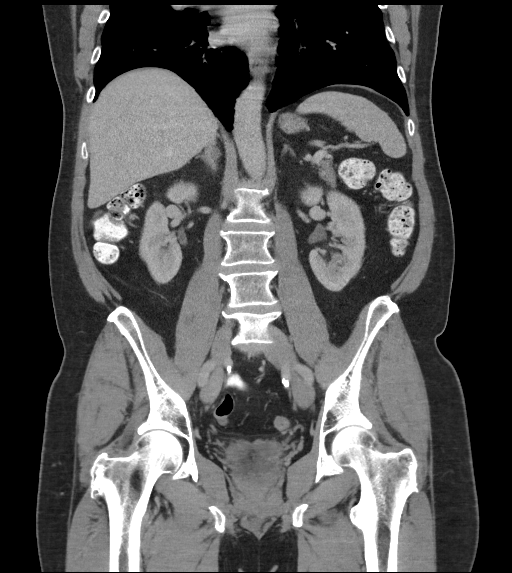
[im 56/101  soft-tissue]
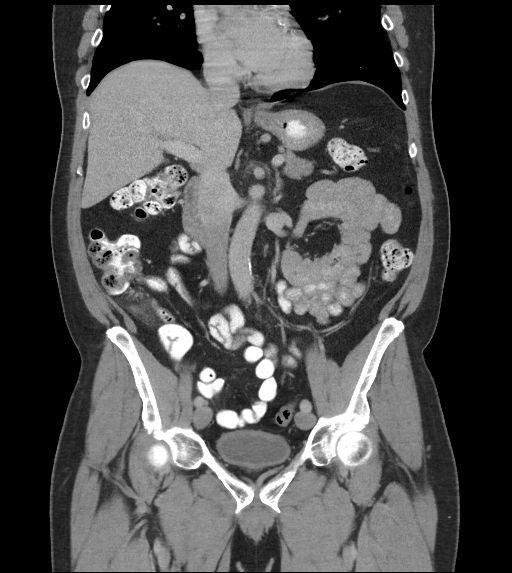

[16 of 46 positions shown; findings below may reference images not displayed]

FINDINGS: Lower chest: No acute abnormality.

Hepatobiliary: The liver and gallbladder are unremarkable. No
biliary dilatation.

Pancreas: Unremarkable

Spleen: Unremarkable

Adrenals/Urinary Tract: 2 punctate nonobstructing renal calculi are
noted within each kidney. A RIGHT renal cyst is present. There is no
evidence of hydronephrosis or obstructing urinary calculi.

Stomach/Bowel: Mildly enlarged appendix with adjacent inflammation
is noted compatible with appendicitis. A 5 mm appendicolith is
noted. There is no evidence of free fluid, abscess or
pneumoperitoneum.

There is no evidence of bowel obstruction, bowel wall thickening or
other inflammatory changes.

Vascular/Lymphatic: Aortic atherosclerosis. No enlarged abdominal or
pelvic lymph nodes.

Reproductive: Prostate enlargement is noted.

Other: No ascites.

Musculoskeletal: No acute or suspicious bony abnormalities are
identified.
IMPRESSION: 1. Appendicitis. No evidence of free fluid, abscess or
pneumoperitoneum.
2. Punctate nonobstructing bilateral renal calculi.
3. Prostate enlargement.
4. Aortic Atherosclerosis ([id number]-CHH.H).

Results were called by telephone at the time of interpretation on
06/22/2021 at [DATE] to Dr. Vanga, who verbally acknowledged these
results.

## 2022-10-06 ENCOUNTER — Ambulatory Visit: Payer: 59 | Admitting: Internal Medicine

## 2022-10-06 ENCOUNTER — Encounter: Payer: Self-pay | Admitting: Internal Medicine

## 2022-10-06 VITALS — BP 120/90 | HR 78 | Ht 71.0 in | Wt 258.5 lb

## 2022-10-06 DIAGNOSIS — K625 Hemorrhage of anus and rectum: Secondary | ICD-10-CM | POA: Diagnosis not present

## 2022-10-06 MED ORDER — NA SULFATE-K SULFATE-MG SULF 17.5-3.13-1.6 GM/177ML PO SOLN: 1.0000 | Freq: Once | ORAL | 0 refills | Status: AC

## 2022-10-06 NOTE — Progress Notes (Signed)
Chief Complaint: Rectal bleeding  HPI : 61 year old male with history of skin cancer and hemorrhoids presents with rectal bleeding  About 3-4 weeks ago he had 4-5 days of bright red blood. There was a reasonable amount in the beginning, but then it progressively decreased. Denies ab pain or rectal pain. Denies constipation or diarrhea. Denies changes in bowel habits. Denies weight loss. Denies family history of colon cancer. Denies use of blood thinners. Sometimes he will take ibuprofen and naproxen about once a week. He had his appendix taken out last year. Denies N&V, dysphagia, and GERD. Eats a varied diet. The only thing that he had done recently was drink 6-7 beers before the bleeding started. Patient states his Hb had normalized on his most recent lab check earlier this month.  Wt Readings from Last 3 Encounters:  10/06/22 258 lb 8 oz (117.3 kg)  06/19/19 241 lb 3.2 oz (109.4 kg)  11/25/18 231 lb (104.8 kg)   Past Medical History:  Diagnosis Date   Skin cancer     Past Surgical History:  Procedure Laterality Date   APPENDECTOMY     Family History  Problem Relation Age of Onset   Vasculitis Mother    Colon cancer Neg Hx    Rectal cancer Neg Hx    Stomach cancer Neg Hx    Esophageal cancer Neg Hx    Social History   Tobacco Use   Smoking status: Former    Packs/day: 1.00    Years: 30.00    Total pack years: 30.00    Types: Cigarettes    Quit date: 05/21/2006    Years since quitting: 16.3   Smokeless tobacco: Never  Vaping Use   Vaping Use: Never used  Substance Use Topics   Alcohol use: Yes    Alcohol/week: 2.0 - 3.0 standard drinks of alcohol    Types: 2 - 3 Cans of beer per week    Comment: occ   Drug use: No   Current Outpatient Medications  Medication Sig Dispense Refill   atorvastatin (LIPITOR) 10 MG tablet      cetirizine (ZYRTEC) 10 MG tablet Take 10 mg by mouth daily.     Cholecalciferol (VITAMIN D3 PO) Take by mouth.     montelukast (SINGULAIR) 10  MG tablet Take 1 tablet (10 mg total) by mouth at bedtime. (Patient not taking: Reported on 10/06/2022) 30 tablet 5   MYORISAN 40 MG capsule  (Patient not taking: Reported on 10/06/2022)     No current facility-administered medications for this visit.   No Known Allergies   Review of Systems: All systems reviewed and negative except where noted in HPI.   Physical Exam: BP (!) 120/90   Pulse 78   Ht '5\' 11"'$  (1.803 m)   Wt 258 lb 8 oz (117.3 kg)   BMI 36.05 kg/m  Constitutional: Pleasant,well-developed, male in no acute distress. HEENT: Normocephalic and atraumatic. Conjunctivae are normal. No scleral icterus. Cardiovascular: Normal rate, regular rhythm.  Pulmonary/chest: Effort normal and breath sounds normal. No wheezing, rales or rhonchi. Abdominal: Soft, nondistended, nontender. Bowel sounds active throughout. There are no masses palpable. No hepatomegaly. Extremities: No edema Neurological: Alert and oriented to person place and time. Skin: Skin is warm and dry. No rashes noted. Psychiatric: Normal mood and affect. Behavior is normal.  Labs 06/2021: CBC with low Hb of 12 and low WBC of 2.7.  Labs 09/2022: CBC with nml Hb and low WBC  CT A/P w/contrast 06/22/21: IMPRESSION:  1. Appendicitis. No evidence of free fluid, abscess or pneumoperitoneum. 2. Punctate nonobstructing bilateral renal calculi. 3. Prostate enlargement. 4. Aortic Atherosclerosis (ICD10-I70.0).  Colonoscopy 02/27/15:   ASSESSMENT AND PLAN: Rectal bleeding Patient presents with an episode of rectal bleeding a few weeks ago. This resolved by itself. He was noted to have hemorrhoids on his last colonoscopy in 2016 but with new onset bleeding, would want to rule out underlying malignancy. Will plan for colonoscopy for further evaluation. He did have anemia in the past but on his most recent labs this month his Hb had normalized. - Colonoscopy [facility]  Christia Reading, MD

## 2022-10-06 NOTE — Patient Instructions (Signed)
You have been scheduled for a colonoscopy. Please follow written instructions given to you at your visit today.  Please pick up your prep supplies at the pharmacy within the next 1-3 days. If you use inhalers (even only as needed), please bring them with you on the day of your procedure.  The  GI providers would like to encourage you to use MYCHART to communicate with providers for non-urgent requests or questions.  Due to long hold times on the telephone, sending your provider a message by MYCHART may be a faster and more efficient way to get a response.  Please allow 48 business hours for a response.  Please remember that this is for non-urgent requests.   Due to recent changes in healthcare laws, you may see the results of your imaging and laboratory studies on MyChart before your provider has had a chance to review them.  We understand that in some cases there may be results that are confusing or concerning to you. Not all laboratory results come back in the same time frame and the provider may be waiting for multiple results in order to interpret others.  Please give us 48 hours in order for your provider to thoroughly review all the results before contacting the office for clarification of your results.    

## 2022-10-12 ENCOUNTER — Encounter: Payer: Self-pay | Admitting: Internal Medicine

## 2022-10-19 ENCOUNTER — Ambulatory Visit (AMBULATORY_SURGERY_CENTER): Payer: 59 | Admitting: Internal Medicine

## 2022-10-19 ENCOUNTER — Encounter: Payer: Self-pay | Admitting: Internal Medicine

## 2022-10-19 VITALS — BP 140/89 | HR 61 | Temp 97.8°F | Resp 17 | Ht 71.0 in | Wt 258.0 lb

## 2022-10-19 DIAGNOSIS — K625 Hemorrhage of anus and rectum: Secondary | ICD-10-CM

## 2022-10-19 DIAGNOSIS — K649 Unspecified hemorrhoids: Secondary | ICD-10-CM | POA: Diagnosis not present

## 2022-10-19 DIAGNOSIS — D123 Benign neoplasm of transverse colon: Secondary | ICD-10-CM | POA: Diagnosis not present

## 2022-10-19 DIAGNOSIS — D122 Benign neoplasm of ascending colon: Secondary | ICD-10-CM | POA: Diagnosis not present

## 2022-10-19 MED ORDER — SODIUM CHLORIDE 0.9 % IV SOLN: 500.0000 mL | Freq: Once | INTRAVENOUS | Status: AC

## 2022-10-19 NOTE — Progress Notes (Signed)
Called to room to assist during endoscopic procedure.  Patient ID and intended procedure confirmed with present staff. Received instructions for my participation in the procedure from the performing physician.  

## 2022-10-19 NOTE — Progress Notes (Unsigned)
Pt's states no medical or surgical changes since previsit or office visit. 

## 2022-10-19 NOTE — Progress Notes (Signed)
Report to PACU, RN, vss, BBS= Clear.  

## 2022-10-19 NOTE — Op Note (Signed)
[facility] Patient Name: Sean Ortiz Procedure Date: 10/19/2022 2:41 PM MRN: [id number] Endoscopist: Adline Mango [place] , , [id number] Age: 61 Referring MD:  Date of Birth: November 30, 1961 Gender: Male Account #: [id number] Procedure:                Colonoscopy Indications:              Rectal bleeding Medicines:                Monitored Anesthesia Care Procedure:                Pre-Anesthesia Assessment:                           - Prior to the procedure, a History and Physical                            was performed, and patient medications and                            allergies were reviewed. The patient's tolerance of                            previous anesthesia was also reviewed. The risks                            and benefits of the procedure and the sedation                            options and risks were discussed with the patient.                            All questions were answered, and informed consent                            was obtained. Prior Anticoagulants: The patient has                            taken no anticoagulant or antiplatelet agents. ASA                            Grade Assessment: II - A patient with mild systemic                            disease. After reviewing the risks and benefits,                            the patient was deemed in satisfactory condition to                            undergo the procedure.                           After obtaining informed consent, the colonoscope  was passed under direct vision. Throughout the                            procedure, the patient's blood pressure, pulse, and                            oxygen saturations were monitored continuously. The                            Colonoscope was introduced through the anus and                            advanced to the the terminal ileum. The colonoscopy                            was performed without difficulty. The  patient                            tolerated the procedure well. The quality of the                            bowel preparation was good. The terminal ileum,                            ileocecal valve, appendiceal orifice, and rectum                            were photographed. Scope In: 2:42:36 PM Scope Out: 3:05:02 PM Scope Withdrawal Time: 0 hours 18 minutes 4 seconds  Total Procedure Duration: 0 hours 22 minutes 26 seconds  Findings:                 The terminal ileum appeared normal.                           Four sessile polyps were found in the transverse                            colon and ascending colon. The polyps were 3 to 5                            mm in size. These polyps were removed with a cold                            snare. Resection and retrieval were complete.                           Non-bleeding internal hemorrhoids were found during                            retroflexion. Complications:            No immediate complications. Estimated Blood Loss:     Estimated blood loss was minimal. Impression:               -  The examined portion of the ileum was normal.                           - Four 3 to 5 mm polyps in the transverse colon and                            in the ascending colon, removed with a cold snare.                            Resected and retrieved.                           - Non-bleeding internal hemorrhoids. Recommendation:           - Discharge patient to home (with escort).                           - Await pathology results.                           - The findings and recommendations were discussed                            with the patient. Dr Georgian Co "Lyndee Leo" Lorenso Courier,  10/19/2022 3:07:45 PM

## 2022-10-19 NOTE — Patient Instructions (Signed)
YOU HAD AN ENDOSCOPIC PROCEDURE TODAY AT [facility]:   Refer to the procedure report that was given to you for any specific questions about what was found during the examination.  If the procedure report does not answer your questions, please call your gastroenterologist to clarify.  If you requested that your care partner not be given the details of your procedure findings, then the procedure report has been included in a sealed envelope for you to review at your convenience later.  YOU SHOULD EXPECT: Some feelings of bloating in the abdomen. Passage of more gas than usual.  Walking can help get rid of the air that was put into your GI tract during the procedure and reduce the bloating. If you had a lower endoscopy (such as a colonoscopy or flexible sigmoidoscopy) you may notice spotting of blood in your stool or on the toilet paper. If you underwent a bowel prep for your procedure, you may not have a normal bowel movement for a few days.  Please Note:  You might notice some irritation and congestion in your nose or some drainage.  This is from the oxygen used during your procedure.  There is no need for concern and it should clear up in a day or so.  SYMPTOMS TO REPORT IMMEDIATELY:  Following lower endoscopy (colonoscopy or flexible sigmoidoscopy):  Excessive amounts of blood in the stool  Significant tenderness or worsening of abdominal pains  Swelling of the abdomen that is new, acute  Fever of 100F or higher   For urgent or emergent issues, a gastroenterologist can be reached at any hour by calling [phone]. Do not use MyChart messaging for urgent concerns.    DIET:  We do recommend a small meal at first, but then you may proceed to your regular diet.  Drink plenty of fluids but you should avoid alcoholic beverages for 24 hours.  MEDICATIONS: Continue present medications.  Please see handouts given to you by your recovery nurse: Polyps and hemorrhoids.  Thank  you for allowing [place] to provide for your healthcare needs today.  ACTIVITY:  You should plan to take it easy for the rest of today and you should NOT DRIVE or use heavy machinery until tomorrow (because of the sedation medicines used during the test).    FOLLOW UP: Our staff will call the number listed on your records the next business day following your procedure.  We will call around 7:15- 8:00 am to check on you and address any questions or concerns that you may have regarding the information given to you following your procedure. If we do not reach you, we will leave a message.     If any biopsies were taken you will be contacted by phone or by letter within the next 1-3 weeks.  Please call us at [phone] if you have not heard about the biopsies in 3 weeks.    SIGNATURES/CONFIDENTIALITY: You and/or your care partner have signed paperwork which will be entered into your electronic medical record.  These signatures attest to the fact that that the information above on your After Visit Summary has been reviewed and is understood.  Full responsibility of the confidentiality of this discharge information lies with you and/or your care-partner.

## 2022-10-19 NOTE — Progress Notes (Unsigned)
GASTROENTEROLOGY PROCEDURE H&P NOTE   Primary Care Physician: Aura Dials, MD    Reason for Procedure:   Rectal bleeding  Plan:    Colonoscopy  Patient is appropriate for endoscopic procedure(s) in the ambulatory ([facility]) setting.  The nature of the procedure, as well as the risks, benefits, and alternatives were carefully and thoroughly reviewed with the patient. Ample time for discussion and questions allowed. The patient understood, was satisfied, and agreed to proceed.     HPI: Sean Ortiz is a 61 y.o. male who presents for colonoscopy for evaluation of rectal bleeding  .  Patient was most recently seen in the Gastroenterology Clinic on 10/06/22.  No interval change in medical history since that appointment. Please refer to that note for full details regarding GI history and clinical presentation.   Past Medical History:  Diagnosis Date   Skin cancer     Past Surgical History:  Procedure Laterality Date   APPENDECTOMY      Prior to Admission medications   Medication Sig Start Date End Date Taking? Authorizing Provider  atorvastatin (LIPITOR) 10 MG tablet  10/18/18  Yes [provider]  cetirizine (ZYRTEC) 10 MG tablet Take 10 mg by mouth daily.   Yes [provider]  Cholecalciferol (VITAMIN D3 PO) Take by mouth.   Yes [provider]  montelukast (SINGULAIR) 10 MG tablet Take 1 tablet (10 mg total) by mouth at bedtime. Patient not taking: Reported on 10/06/2022 11/25/18   Valentina Shaggy, MD  [facility] 40 MG capsule  11/07/18   [provider]    Current Outpatient Medications  Medication Sig Dispense Refill   atorvastatin (LIPITOR) 10 MG tablet      cetirizine (ZYRTEC) 10 MG tablet Take 10 mg by mouth daily.     Cholecalciferol (VITAMIN D3 PO) Take by mouth.     montelukast (SINGULAIR) 10 MG tablet Take 1 tablet (10 mg total) by mouth at bedtime. (Patient not taking: Reported on 10/06/2022) 30 tablet 5   MYORISAN 40 MG  capsule  (Patient not taking: Reported on 10/06/2022)     Current Facility-Administered Medications  Medication Dose Route Frequency Provider Last Rate Last Admin   0.9 %  sodium chloride infusion  500 mL Intravenous Once Sharyn Creamer, MD        Allergies as of 10/19/2022   (No Active Allergies)    Family History  Problem Relation Age of Onset   Vasculitis Mother    Colon cancer Neg Hx    Rectal cancer Neg Hx    Stomach cancer Neg Hx    Esophageal cancer Neg Hx     Social History   Socioeconomic History   Marital status: Divorced    Spouse name: Not on file   Number of children: 4   Years of education: Not on file   Highest education level: Not on file  Occupational History   Not on file  Tobacco Use   Smoking status: Former    Packs/day: 1.00    Years: 30.00    Total pack years: 30.00    Types: Cigarettes    Quit date: 05/21/2006    Years since quitting: 16.4   Smokeless tobacco: Never  Vaping Use   Vaping Use: Never used  Substance and Sexual Activity   Alcohol use: Yes    Alcohol/week: 2.0 - 3.0 standard drinks of alcohol    Types: 2 - 3 Cans of beer per week    Comment: occ  Drug use: No   Sexual activity: Not on file  Other Topics Concern   Not on file  Social History Narrative   Not on file   Social Determinants of Health   Financial Resource Strain: Not on file  Food Insecurity: Not on file  Transportation Needs: Not on file  Physical Activity: Not on file  Stress: Not on file  Social Connections: Not on file  Intimate Partner Violence: Not on file    Physical Exam: Vital signs in last 24 hours: BP 130/77   Pulse 63   Temp 97.8 F (36.6 C)   Ht '5\' 11"'$  (1.803 m)   Wt 258 lb (117 kg)   SpO2 98%   BMI 35.98 kg/m  GEN: NAD EYE: Sclerae anicteric ENT: MMM CV: Non-tachycardic Pulm: No increased WOB GI: Soft NEURO:  Alert & Oriented   Christia Reading, MD [facility]   10/19/2022 2:01 PM

## 2022-10-20 ENCOUNTER — Telehealth: Payer: Self-pay

## 2022-10-20 NOTE — Telephone Encounter (Signed)
Follow up call placed, VM obtained and message left. 

## 2022-10-22 ENCOUNTER — Encounter: Payer: Self-pay | Admitting: Internal Medicine

## 2024-10-02 ENCOUNTER — Other Ambulatory Visit: Payer: Self-pay | Admitting: Physician Assistant

## 2024-10-02 DIAGNOSIS — M25561 Pain in right knee: Secondary | ICD-10-CM

## 2024-10-02 DIAGNOSIS — G8929 Other chronic pain: Secondary | ICD-10-CM

## 2024-10-08 ENCOUNTER — Ambulatory Visit (INDEPENDENT_AMBULATORY_CARE_PROVIDER_SITE_OTHER)

## 2024-10-08 DIAGNOSIS — M25561 Pain in right knee: Secondary | ICD-10-CM | POA: Diagnosis not present

## 2024-10-08 DIAGNOSIS — G8929 Other chronic pain: Secondary | ICD-10-CM
